# Patient Record
Sex: Male | Born: 1960 | Race: Black or African American | Hispanic: No | Marital: Single | Smoking: Never smoker
Health system: Southern US, Community
[De-identification: ages and names within clinical notes are randomized; demographics above are authoritative.]

## PROBLEM LIST (undated history)

## (undated) DIAGNOSIS — F329 Major depressive disorder, single episode, unspecified: Secondary | ICD-10-CM

## (undated) DIAGNOSIS — F32A Depression, unspecified: Secondary | ICD-10-CM

## (undated) DIAGNOSIS — I10 Essential (primary) hypertension: Secondary | ICD-10-CM

## (undated) HISTORY — PX: BACK SURGERY: SHX140

---

## 1998-04-18 ENCOUNTER — Emergency Department (HOSPITAL_COMMUNITY): Admission: EM | Admit: 1998-04-18 | Discharge: 1998-04-18 | Payer: Self-pay | Admitting: Emergency Medicine

## 1998-05-08 ENCOUNTER — Emergency Department (HOSPITAL_COMMUNITY): Admission: EM | Admit: 1998-05-08 | Discharge: 1998-05-08 | Payer: Self-pay | Admitting: Emergency Medicine

## 1998-06-19 ENCOUNTER — Emergency Department (HOSPITAL_COMMUNITY): Admission: EM | Admit: 1998-06-19 | Discharge: 1998-06-19 | Payer: Self-pay | Admitting: Emergency Medicine

## 1998-10-30 ENCOUNTER — Encounter: Admission: RE | Admit: 1998-10-30 | Discharge: 1998-12-02 | Payer: Self-pay

## 1999-06-07 ENCOUNTER — Emergency Department (HOSPITAL_COMMUNITY): Admission: EM | Admit: 1999-06-07 | Discharge: 1999-06-07 | Payer: Self-pay | Admitting: *Deleted

## 2000-03-11 ENCOUNTER — Emergency Department (HOSPITAL_COMMUNITY): Admission: EM | Admit: 2000-03-11 | Discharge: 2000-03-11 | Payer: Self-pay | Admitting: *Deleted

## 2000-07-11 ENCOUNTER — Emergency Department (HOSPITAL_COMMUNITY): Admission: EM | Admit: 2000-07-11 | Discharge: 2000-07-11 | Payer: Self-pay | Admitting: Emergency Medicine

## 2000-07-11 ENCOUNTER — Encounter: Payer: Self-pay | Admitting: Emergency Medicine

## 2001-02-03 ENCOUNTER — Encounter: Payer: Self-pay | Admitting: Emergency Medicine

## 2001-02-03 ENCOUNTER — Emergency Department (HOSPITAL_COMMUNITY): Admission: EM | Admit: 2001-02-03 | Discharge: 2001-02-03 | Payer: Self-pay | Admitting: Emergency Medicine

## 2001-07-21 ENCOUNTER — Emergency Department (HOSPITAL_COMMUNITY): Admission: EM | Admit: 2001-07-21 | Discharge: 2001-07-22 | Payer: Self-pay | Admitting: Emergency Medicine

## 2001-09-21 ENCOUNTER — Emergency Department (HOSPITAL_COMMUNITY): Admission: EM | Admit: 2001-09-21 | Discharge: 2001-09-21 | Payer: Self-pay | Admitting: Emergency Medicine

## 2002-08-26 ENCOUNTER — Emergency Department (HOSPITAL_COMMUNITY): Admission: EM | Admit: 2002-08-26 | Discharge: 2002-08-27 | Payer: Self-pay | Admitting: Emergency Medicine

## 2003-09-15 ENCOUNTER — Emergency Department (HOSPITAL_COMMUNITY): Admission: AD | Admit: 2003-09-15 | Discharge: 2003-09-15 | Payer: Self-pay | Admitting: Internal Medicine

## 2004-08-17 ENCOUNTER — Emergency Department (HOSPITAL_COMMUNITY): Admission: EM | Admit: 2004-08-17 | Discharge: 2004-08-17 | Payer: Self-pay | Admitting: Emergency Medicine

## 2005-03-31 ENCOUNTER — Emergency Department (HOSPITAL_COMMUNITY): Admission: EM | Admit: 2005-03-31 | Discharge: 2005-03-31 | Payer: Self-pay | Admitting: Emergency Medicine

## 2005-10-04 ENCOUNTER — Emergency Department (HOSPITAL_COMMUNITY): Admission: EM | Admit: 2005-10-04 | Discharge: 2005-10-04 | Payer: Self-pay | Admitting: Emergency Medicine

## 2007-02-05 ENCOUNTER — Emergency Department (HOSPITAL_COMMUNITY): Admission: EM | Admit: 2007-02-05 | Discharge: 2007-02-05 | Payer: Self-pay | Admitting: Emergency Medicine

## 2007-05-19 ENCOUNTER — Emergency Department (HOSPITAL_COMMUNITY): Admission: EM | Admit: 2007-05-19 | Discharge: 2007-05-20 | Payer: Self-pay | Admitting: Emergency Medicine

## 2007-09-11 ENCOUNTER — Emergency Department (HOSPITAL_COMMUNITY): Admission: EM | Admit: 2007-09-11 | Discharge: 2007-09-11 | Payer: Self-pay | Admitting: *Deleted

## 2008-04-12 ENCOUNTER — Emergency Department (HOSPITAL_COMMUNITY): Admission: EM | Admit: 2008-04-12 | Discharge: 2008-04-12 | Payer: Self-pay | Admitting: *Deleted

## 2008-05-19 ENCOUNTER — Emergency Department (HOSPITAL_COMMUNITY): Admission: EM | Admit: 2008-05-19 | Discharge: 2008-05-19 | Payer: Self-pay | Admitting: Emergency Medicine

## 2008-06-20 ENCOUNTER — Emergency Department (HOSPITAL_COMMUNITY): Admission: EM | Admit: 2008-06-20 | Discharge: 2008-06-20 | Payer: Self-pay | Admitting: Emergency Medicine

## 2009-11-07 ENCOUNTER — Inpatient Hospital Stay (HOSPITAL_COMMUNITY): Admission: RE | Admit: 2009-11-07 | Discharge: 2009-11-11 | Payer: Self-pay | Admitting: Orthopedic Surgery

## 2009-11-08 ENCOUNTER — Ambulatory Visit: Payer: Self-pay | Admitting: Vascular Surgery

## 2010-03-08 ENCOUNTER — Emergency Department (HOSPITAL_COMMUNITY): Admission: EM | Admit: 2010-03-08 | Discharge: 2010-03-08 | Payer: Self-pay | Admitting: Emergency Medicine

## 2010-06-16 ENCOUNTER — Emergency Department (HOSPITAL_BASED_OUTPATIENT_CLINIC_OR_DEPARTMENT_OTHER): Admission: EM | Admit: 2010-06-16 | Discharge: 2010-06-17 | Payer: Self-pay | Admitting: Emergency Medicine

## 2010-07-23 ENCOUNTER — Emergency Department (HOSPITAL_BASED_OUTPATIENT_CLINIC_OR_DEPARTMENT_OTHER): Admission: EM | Admit: 2010-07-23 | Discharge: 2010-07-23 | Payer: Self-pay | Admitting: Emergency Medicine

## 2010-08-28 ENCOUNTER — Ambulatory Visit: Payer: Self-pay | Admitting: Diagnostic Radiology

## 2010-08-28 ENCOUNTER — Emergency Department (HOSPITAL_BASED_OUTPATIENT_CLINIC_OR_DEPARTMENT_OTHER): Admission: EM | Admit: 2010-08-28 | Discharge: 2010-08-28 | Payer: Self-pay | Admitting: Emergency Medicine

## 2010-12-28 LAB — GLUCOSE, CAPILLARY
Glucose-Capillary: 154 mg/dL — ABNORMAL HIGH (ref 70–99)
Glucose-Capillary: 160 mg/dL — ABNORMAL HIGH (ref 70–99)
Glucose-Capillary: 169 mg/dL — ABNORMAL HIGH (ref 70–99)
Glucose-Capillary: 175 mg/dL — ABNORMAL HIGH (ref 70–99)
Glucose-Capillary: 186 mg/dL — ABNORMAL HIGH (ref 70–99)
Glucose-Capillary: 230 mg/dL — ABNORMAL HIGH (ref 70–99)
Glucose-Capillary: 239 mg/dL — ABNORMAL HIGH (ref 70–99)
Glucose-Capillary: 251 mg/dL — ABNORMAL HIGH (ref 70–99)
Glucose-Capillary: 289 mg/dL — ABNORMAL HIGH (ref 70–99)

## 2010-12-28 LAB — BASIC METABOLIC PANEL
Chloride: 95 mEq/L — ABNORMAL LOW (ref 96–112)
Creatinine, Ser: 1.01 mg/dL (ref 0.4–1.5)
Sodium: 133 mEq/L — ABNORMAL LOW (ref 135–145)

## 2010-12-28 LAB — TYPE AND SCREEN: ABO/RH(D): A POS

## 2010-12-28 LAB — CBC
Hemoglobin: 12.9 g/dL — ABNORMAL LOW (ref 13.0–17.0)
Platelets: 233 10*3/uL (ref 150–400)
RBC: 4.18 MIL/uL — ABNORMAL LOW (ref 4.22–5.81)

## 2011-01-01 IMAGING — CR DG CHEST 2V
2 series · 2 of 2 positions shown · non-contrast
Comparison: Chest x-ray of 09/11/2007

CLINICAL DATA: Preop for lumbar spine surgery

CHEST - 2 VIEW

[view not recorded (1 of 2)]
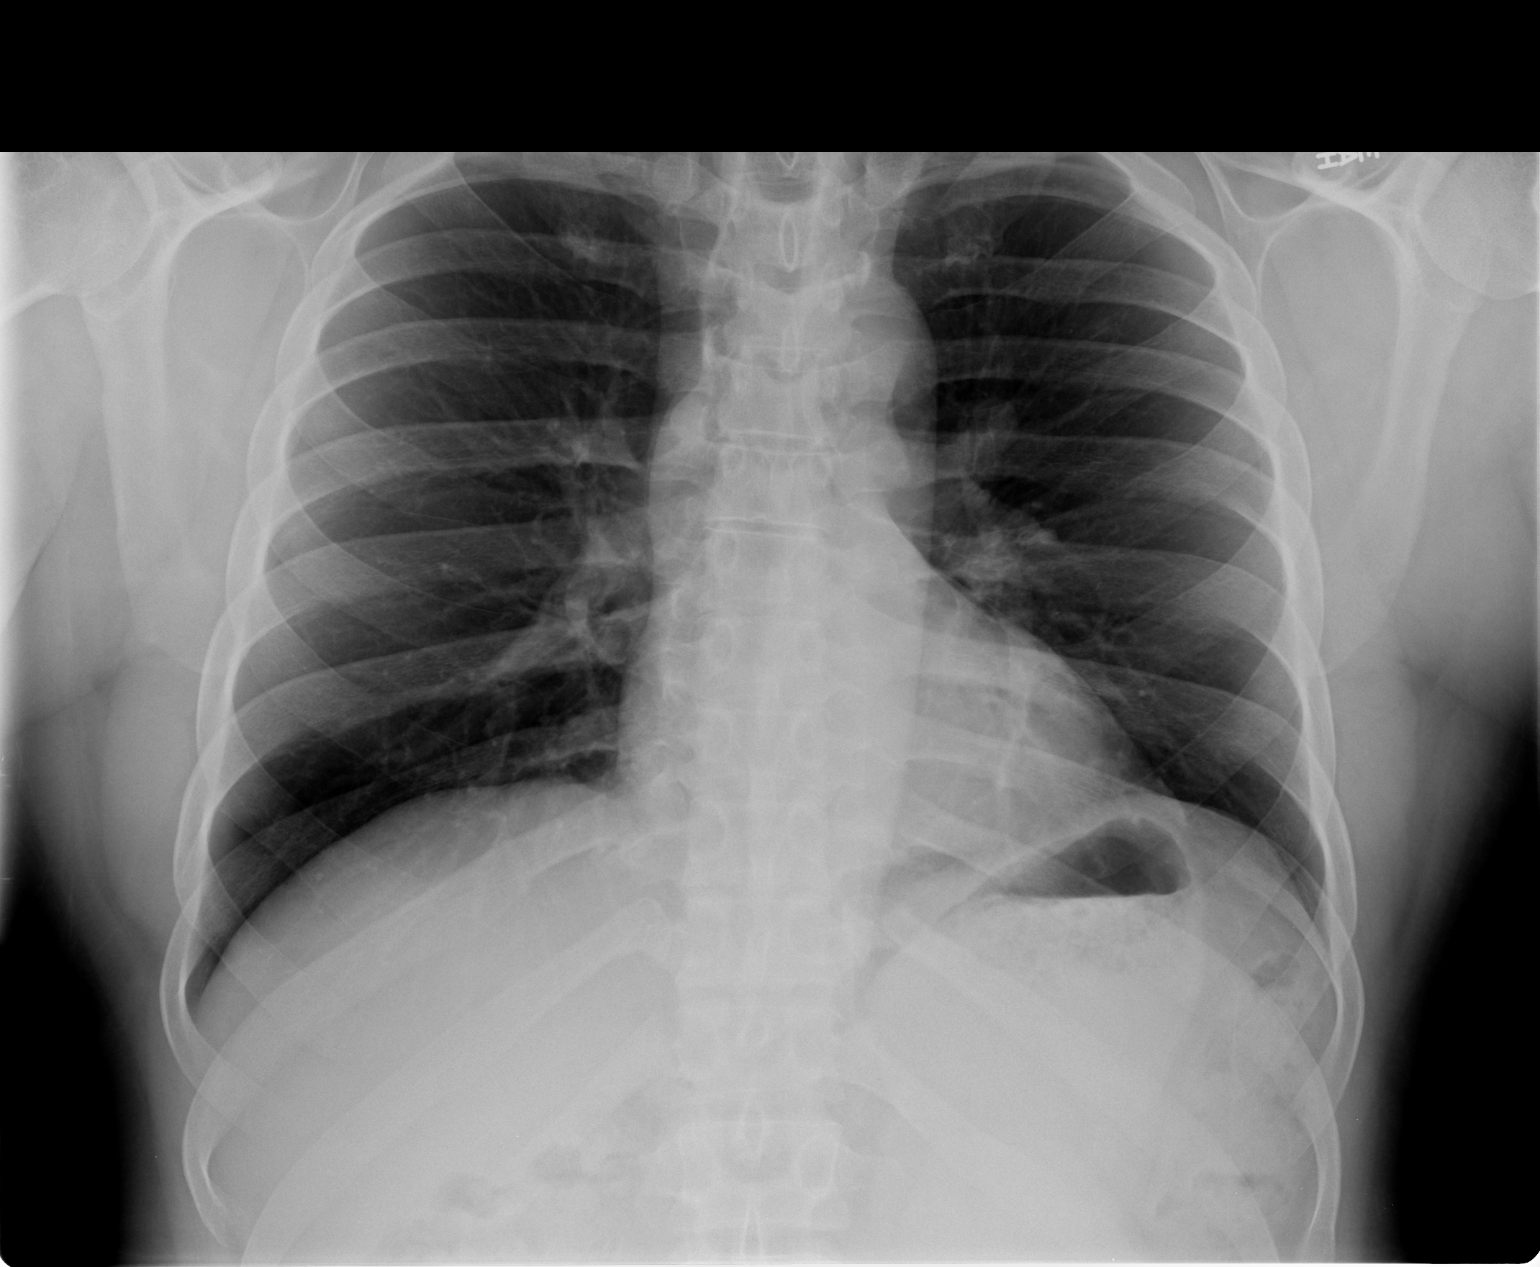

[view not recorded (2 of 2)]
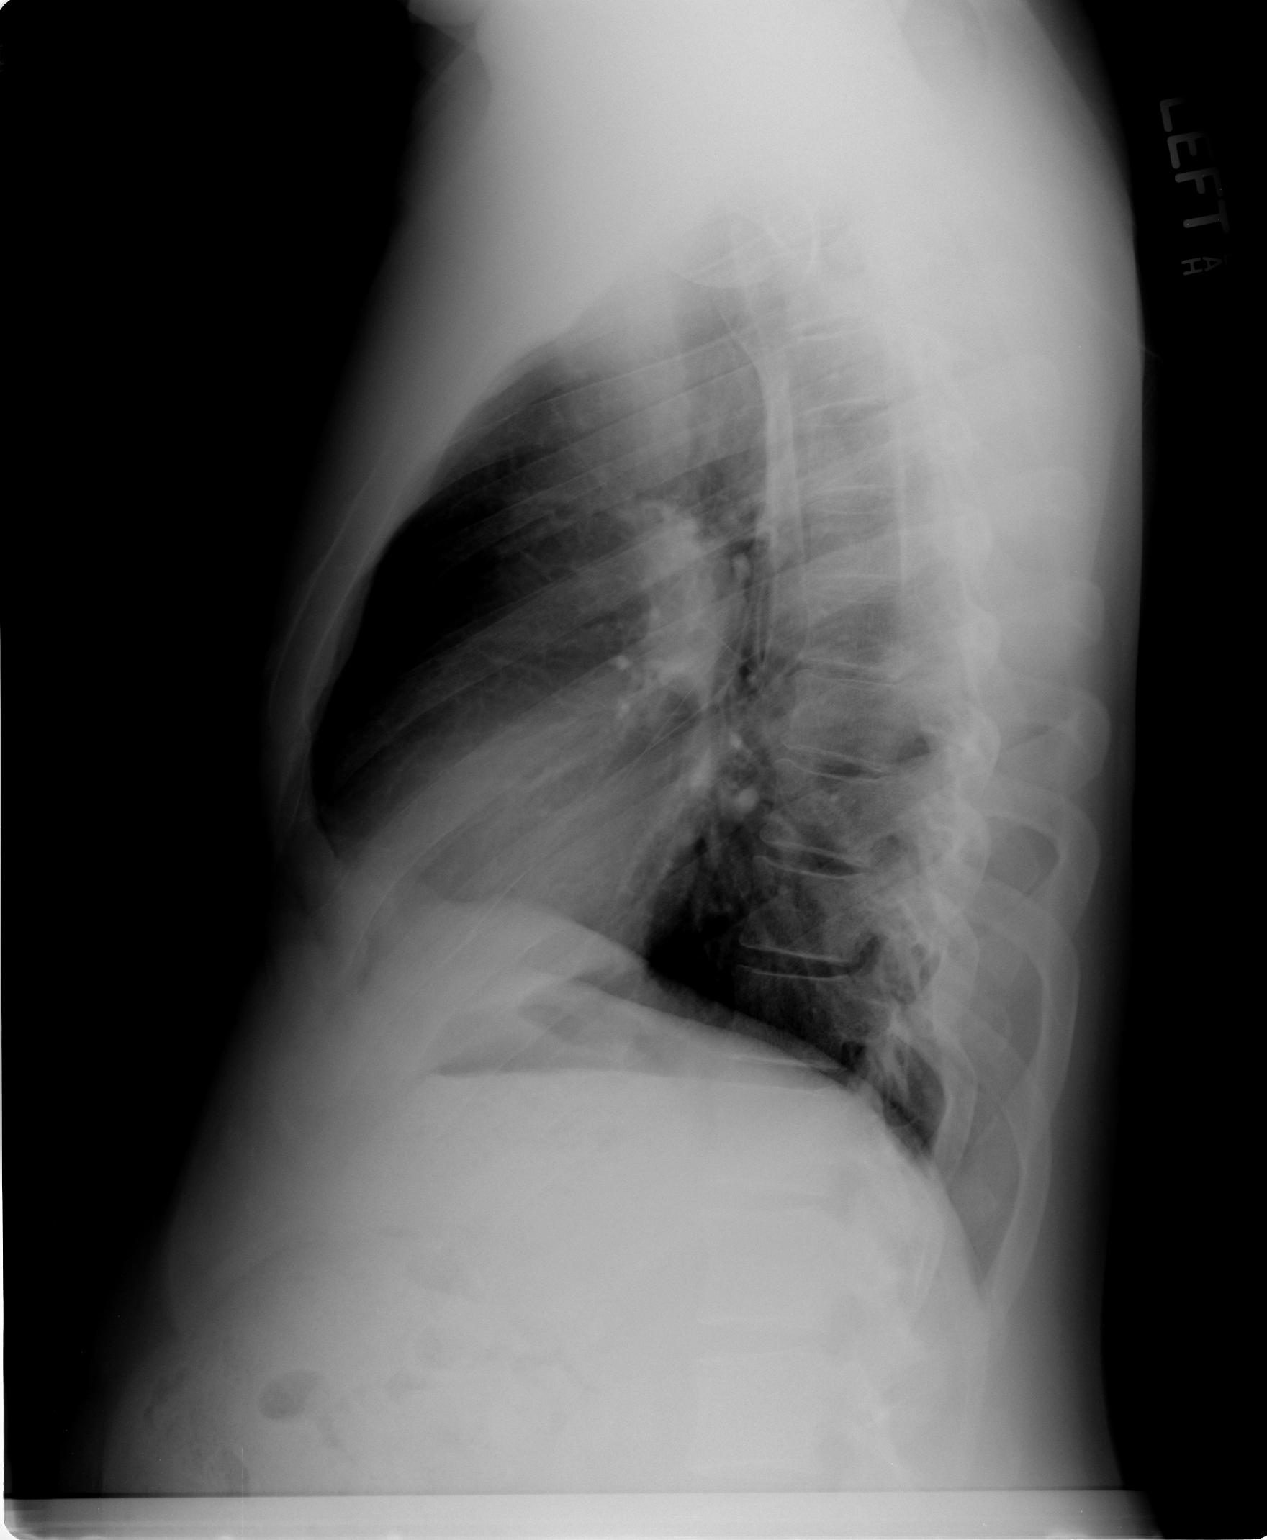

[2 of 2 positions shown; findings below may reference images not displayed]

FINDINGS: The lungs are clear.  Mediastinal contours are normal.
The heart is within normal limits in size.  No acute bony
abnormality is seen.
IMPRESSION: No active lung disease.

## 2011-01-01 IMAGING — CR DG LUMBAR SPINE 2-3V
2 series · 2 of 2 positions shown · non-contrast
Comparison: None.

CLINICAL DATA: 48-year-old male preoperative study for lumbar
surgery.

LUMBAR SPINE - 2-3 VIEW

[view not recorded (1 of 2)]
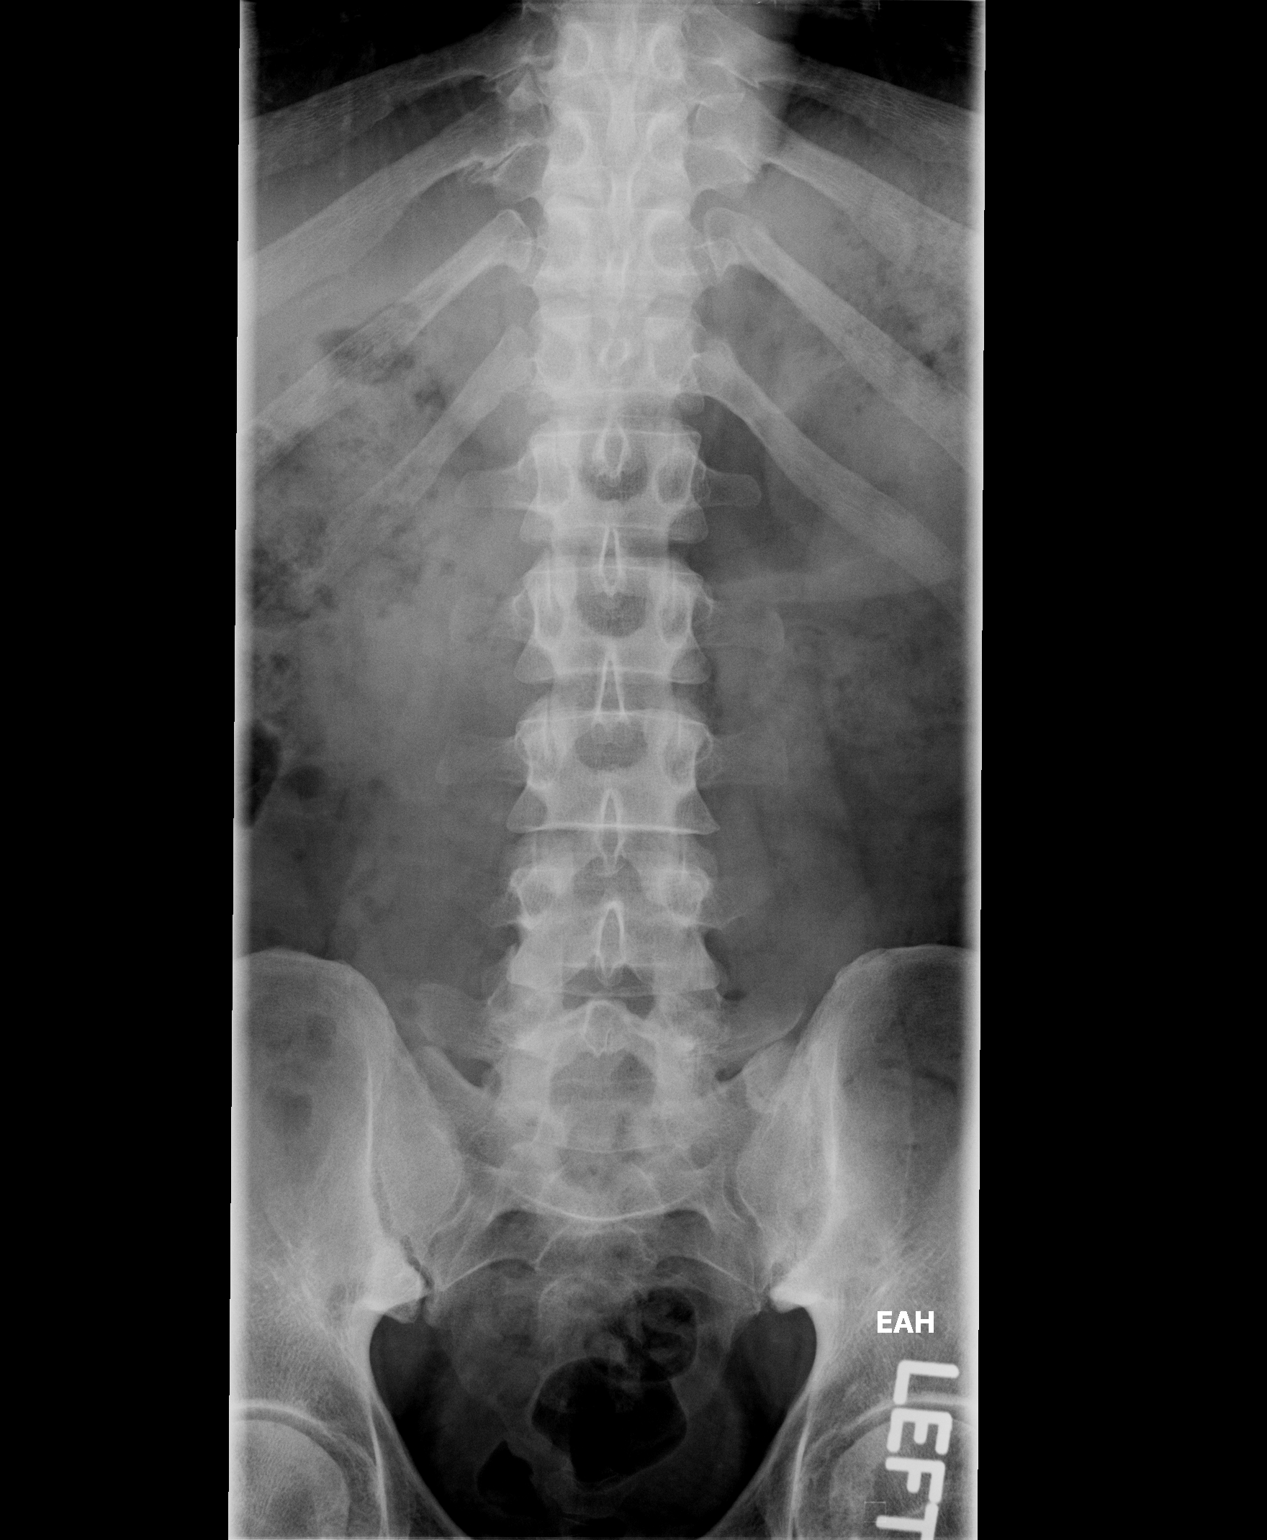

[view not recorded (2 of 2)]
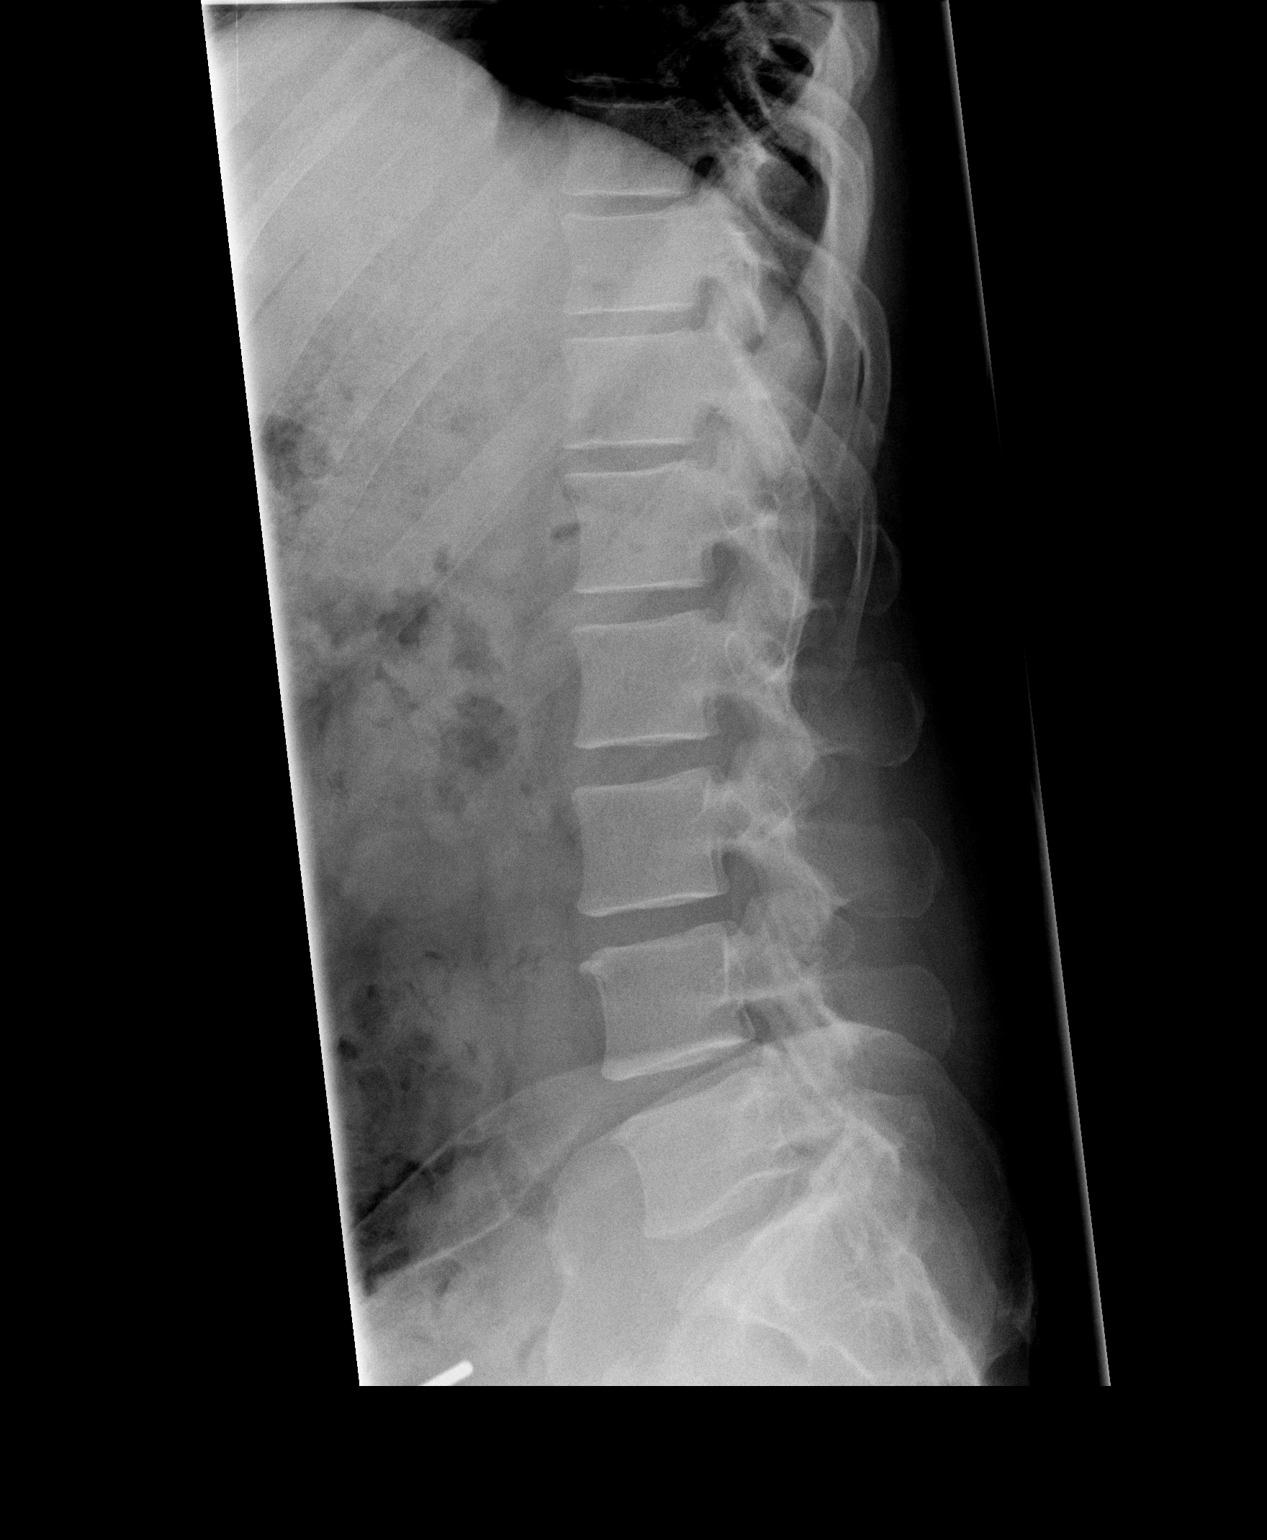

[2 of 2 positions shown; findings below may reference images not displayed]

FINDINGS: Bone mineralization is within normal limits.
Straightening of the spine.  Normal lumbar segmentation.
Relatively preserved disc space heights.  Preserved fibril body
height and alignment.
IMPRESSION: No acute osseous abnormality identified in the lumbar spine.  The
lumbar levels on these images were numbered in anticipation of
surgery.

## 2011-01-03 IMAGING — RF DG LUMBAR SPINE 2-3V
1 series · 2 of 2 positions shown · non-contrast
Comparison: 11/05/2009

CLINICAL DATA: L4-L5 lumbar fusion.

LUMBAR SPINE - 2-3 VIEW

[Series 1: run · 2 of 2 slices shown]
[im 1/2]
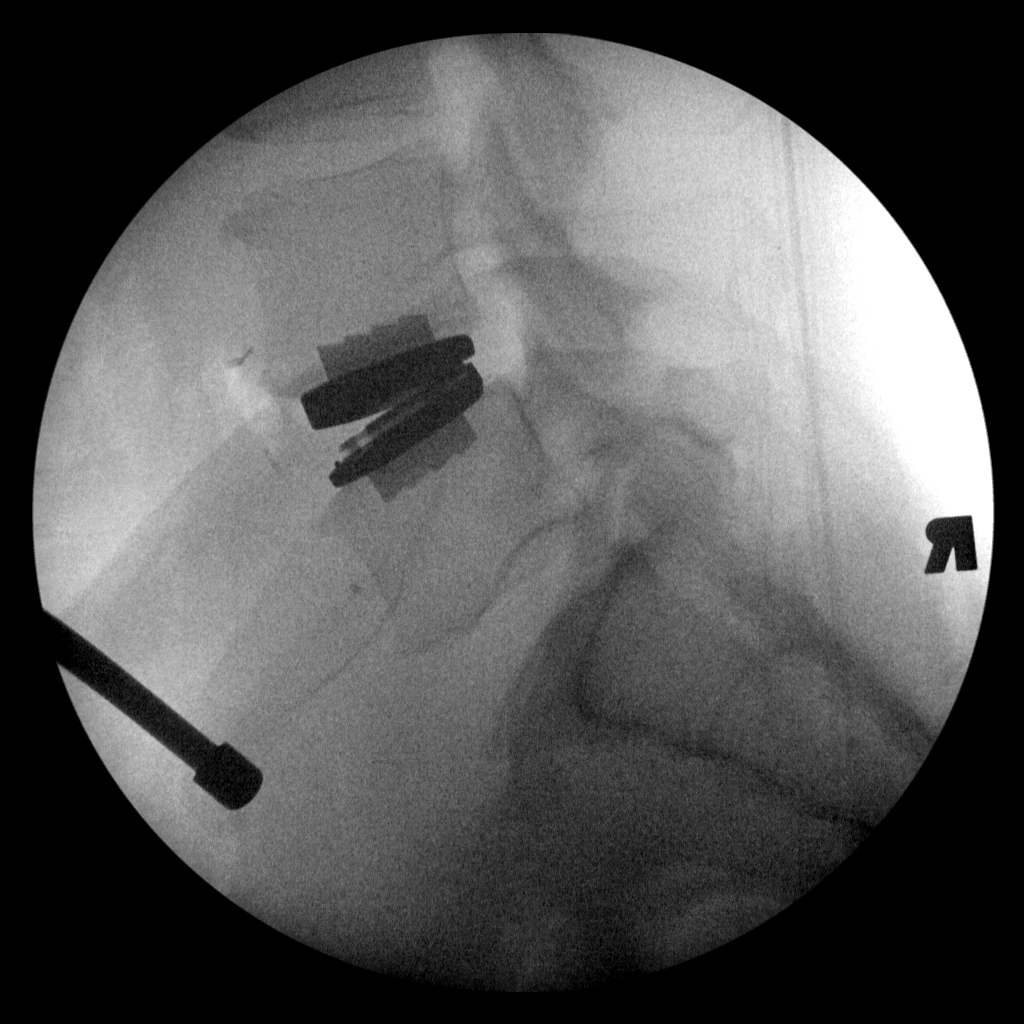
[im 2/2]
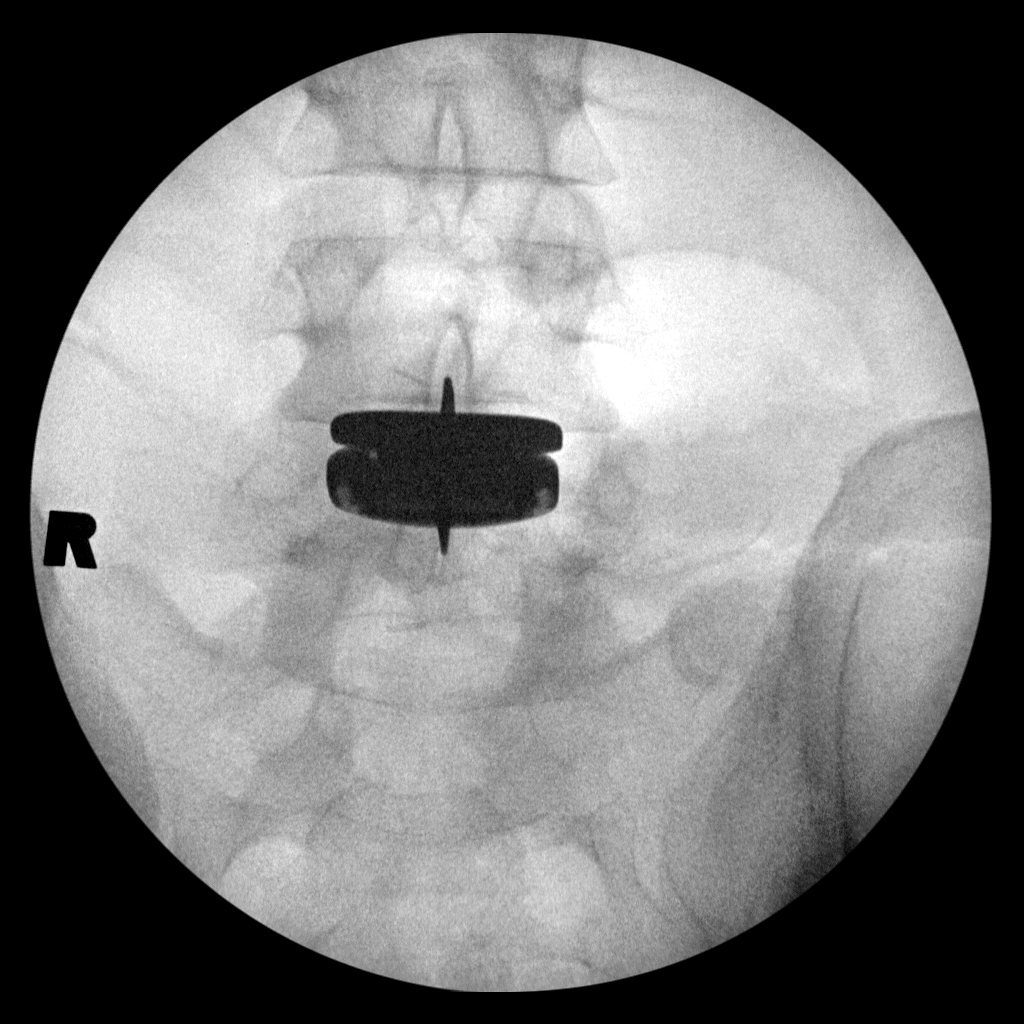

[2 of 2 positions shown; findings below may reference images not displayed]

FINDINGS: Two intraoperative spot views of the lower lumbar spine
are submitted postoperatively for interpretation.
An intervertebral disc replacement is identified at L4-L5.
No other significant abnormalities are present.
IMPRESSION: Intervertebral disc replacement at L4-L5.

## 2011-03-28 ENCOUNTER — Emergency Department (HOSPITAL_BASED_OUTPATIENT_CLINIC_OR_DEPARTMENT_OTHER)
Admission: EM | Admit: 2011-03-28 | Discharge: 2011-03-28 | Disposition: A | Discharge status: Home / Self Care | Payer: Self-pay | Attending: Emergency Medicine | Admitting: Emergency Medicine

## 2011-03-28 DIAGNOSIS — I10 Essential (primary) hypertension: Secondary | ICD-10-CM | POA: Insufficient documentation

## 2011-03-28 DIAGNOSIS — E119 Type 2 diabetes mellitus without complications: Secondary | ICD-10-CM | POA: Insufficient documentation

## 2011-03-28 DIAGNOSIS — K0510 Chronic gingivitis, plaque induced: Secondary | ICD-10-CM | POA: Insufficient documentation

## 2011-07-05 ENCOUNTER — Emergency Department (HOSPITAL_COMMUNITY)
Admission: EM | Admit: 2011-07-05 | Discharge: 2011-07-05 | Disposition: A | Discharge status: Home / Self Care | Payer: Self-pay | Attending: Emergency Medicine | Admitting: Emergency Medicine

## 2011-07-05 DIAGNOSIS — E119 Type 2 diabetes mellitus without complications: Secondary | ICD-10-CM | POA: Insufficient documentation

## 2011-07-05 DIAGNOSIS — I10 Essential (primary) hypertension: Secondary | ICD-10-CM | POA: Insufficient documentation

## 2011-07-05 DIAGNOSIS — M542 Cervicalgia: Secondary | ICD-10-CM | POA: Insufficient documentation

## 2011-07-05 DIAGNOSIS — M62838 Other muscle spasm: Secondary | ICD-10-CM | POA: Insufficient documentation

## 2011-07-15 LAB — POCT I-STAT, CHEM 8
HCT: 42
Hemoglobin: 14.3
Potassium: 3.8
Sodium: 136
TCO2: 28

## 2011-07-15 LAB — DIFFERENTIAL
Basophils Absolute: 0
Basophils Relative: 0
Eosinophils Absolute: 0
Eosinophils Relative: 0
Monocytes Absolute: 0.8
Neutro Abs: 3.7

## 2011-07-15 LAB — HEPATIC FUNCTION PANEL
AST: 23
Bilirubin, Direct: 0.1
Indirect Bilirubin: 1 — ABNORMAL HIGH

## 2011-07-15 LAB — CBC
Hemoglobin: 13.6
MCHC: 33.3
MCV: 91.7
RDW: 13.3

## 2011-07-15 LAB — LIPASE, BLOOD: Lipase: 20

## 2011-07-15 LAB — GLUCOSE, CAPILLARY: Glucose-Capillary: 246 — ABNORMAL HIGH

## 2011-09-20 ENCOUNTER — Encounter: Payer: Self-pay | Admitting: Emergency Medicine

## 2011-09-20 ENCOUNTER — Emergency Department (HOSPITAL_COMMUNITY)
Admission: EM | Admit: 2011-09-20 | Discharge: 2011-09-21 | Disposition: A | Discharge status: Home / Self Care | Payer: Self-pay | Attending: Emergency Medicine | Admitting: Emergency Medicine

## 2011-09-20 DIAGNOSIS — F329 Major depressive disorder, single episode, unspecified: Secondary | ICD-10-CM

## 2011-09-20 DIAGNOSIS — Z79899 Other long term (current) drug therapy: Secondary | ICD-10-CM | POA: Insufficient documentation

## 2011-09-20 DIAGNOSIS — F3289 Other specified depressive episodes: Secondary | ICD-10-CM | POA: Insufficient documentation

## 2011-09-20 DIAGNOSIS — I10 Essential (primary) hypertension: Secondary | ICD-10-CM | POA: Insufficient documentation

## 2011-09-20 DIAGNOSIS — E119 Type 2 diabetes mellitus without complications: Secondary | ICD-10-CM | POA: Insufficient documentation

## 2011-09-20 DIAGNOSIS — R45851 Suicidal ideations: Secondary | ICD-10-CM | POA: Insufficient documentation

## 2011-09-20 HISTORY — DX: Essential (primary) hypertension: I10

## 2011-09-20 HISTORY — DX: Major depressive disorder, single episode, unspecified: F32.9

## 2011-09-20 HISTORY — DX: Depression, unspecified: F32.A

## 2011-09-20 LAB — COMPREHENSIVE METABOLIC PANEL
Albumin: 4.1 g/dL (ref 3.5–5.2)
BUN: 13 mg/dL (ref 6–23)
Calcium: 9.8 mg/dL (ref 8.4–10.5)
Chloride: 103 mEq/L (ref 96–112)
Creatinine, Ser: 0.89 mg/dL (ref 0.50–1.35)
Total Bilirubin: 0.6 mg/dL (ref 0.3–1.2)

## 2011-09-20 LAB — CBC
HCT: 36.2 % — ABNORMAL LOW (ref 39.0–52.0)
MCH: 30.5 pg (ref 26.0–34.0)
MCHC: 35.4 g/dL (ref 30.0–36.0)
MCV: 86.4 fL (ref 78.0–100.0)
RDW: 14.3 % (ref 11.5–15.5)

## 2011-09-20 LAB — RAPID URINE DRUG SCREEN, HOSP PERFORMED
Cocaine: POSITIVE — AB
Opiates: NOT DETECTED
Tetrahydrocannabinol: NOT DETECTED

## 2011-09-20 MED ORDER — ACETAMINOPHEN 325 MG PO TABS: 650.0000 mg | ORAL_TABLET | ORAL | Status: DC | PRN

## 2011-09-20 MED ORDER — LORAZEPAM 1 MG PO TABS: 1.0000 mg | ORAL_TABLET | Freq: Three times a day (TID) | ORAL | Status: DC | PRN

## 2011-09-20 NOTE — ED Notes (Signed)
Pt was wanded by security.  Pt had a knife on him that was given to his mother per Jonny Ruiz in security.

## 2011-09-20 NOTE — ED Provider Notes (Signed)
History     CSN: [id number] Arrival date & time: 09/20/2011  7:07 PM   First MD Initiated Contact with Patient 09/20/11 1947      Chief Complaint  Patient presents with  . Depression    (Consider location/radiation/quality/duration/timing/severity/associated sxs/prior treatment) The history is provided by the patient.  pt w hx depression states is feeling very depressed. States wife died 17 years ago, depressed this time of year since. Occasionally having suicidal thoughts, currently denies thoughts of harm to self or others. Denies any other recent health related symptoms or complaints. Denies etoh abuse, but states is trying to cut back. Denies hx etoh withdrawal or seizures. Denies drug abuse. Normal appetite, eating normally. Denies od or misuse of meds. Symptoms waxing and waning.  Past Medical History  Diagnosis Date  . Depression   . Hypertension   . Diabetes mellitus     Past Surgical History  Procedure Date  . Back surgery     No family history on file.  History  Substance Use Topics  . Smoking status: Never Smoker   . Smokeless tobacco: Not on file  . Alcohol Use: Yes      Review of Systems  Constitutional: Negative for fever.  HENT: Negative for neck pain.   Eyes: Negative for redness.  Respiratory: Negative for shortness of breath.   Cardiovascular: Negative for chest pain.  Gastrointestinal: Negative for abdominal pain.  Genitourinary: Negative for flank pain.  Musculoskeletal: Negative for back pain.  Skin: Negative for rash.  Neurological: Negative for headaches.  Hematological: Does not bruise/bleed easily.  Psychiatric/Behavioral: Negative for agitation.    Allergies  Review of patient's allergies indicates no known allergies.  Home Medications   Current Outpatient Rx  Name Route Sig Dispense Refill  . GLIPIZIDE 10 MG PO TABS Oral Take 10 mg by mouth 2 (two) times daily before a meal.      . TRIAMTERENE-HCTZ 37.5-25 MG PO TABS Oral Take  1 tablet by mouth daily.        BP 125/75  Pulse 103  Temp(Src) 98.9 F (37.2 C) (Oral)  Resp 18  SpO2 99%  Physical Exam  Nursing note and vitals reviewed. Constitutional: He is oriented to person, place, and time. He appears well-developed and well-nourished. No distress.  HENT:  Head: Atraumatic.  Eyes: Pupils are equal, round, and reactive to light.  Neck: Neck supple. No tracheal deviation present.  Cardiovascular: Normal rate, regular rhythm, normal heart sounds and intact distal pulses.   Pulmonary/Chest: Effort normal and breath sounds normal. No accessory muscle usage. No respiratory distress.  Abdominal: Soft. There is no tenderness.  Musculoskeletal: Normal range of motion. He exhibits no edema and no tenderness.  Neurological: He is alert and oriented to person, place, and time.  Skin: Skin is warm and dry.  Psychiatric: He has a normal mood and affect.    ED Course  Procedures (including critical care time)     MDM  Labs sent. [facility] team consulted. Signed out to 3-12 EDP to f/u on labs when back and with act assessment, and dispo accordingly.         Suzi Roots, MD 09/20/11 [phone]

## 2011-09-20 NOTE — ED Notes (Signed)
Pt c/o depression.  States his wife died 17 years ago and he usually gets depressed between October and December.  States he saw one of his wife's friends that did not know she had died and the person asked about her and he has been depressed since then. Reports suicidal thoughts and thoughts of hurting other people.  Denies plan.

## 2011-09-20 NOTE — ED Notes (Signed)
[facility] person is at the bedside at this time.

## 2011-09-20 NOTE — ED Notes (Signed)
Pt is brought to room #26. Explained procedures to patient and he is agreeable. Lab is in at this time to draw pt blood work. Pt is given a urinal and asked to provide sample. Pt states that he is unable to do so at this time. Pt is provided with ice water to drink. Pt also states that he is depressed as his wife died 17 yrs ago. Pt states that he had suicidal ideations 2 weeks ago but is not having those thoughts at this time.

## 2011-09-20 NOTE — ED Notes (Signed)
[facility] is being repaged to find out their ETA.

## 2011-09-20 NOTE — ED Notes (Signed)
Pt is in room asking how much longer before [facility] team gets here. He states that if they are not here before 12 midnight that he is leaving. MD notified.

## 2011-09-20 NOTE — ED Notes (Signed)
Finger glucose is 230.

## 2011-09-21 NOTE — ED Notes (Signed)
[facility] worker states that this pt can go home. Pt is up for discharge. As soon as I get his paper work will get him discharged.

## 2011-09-21 NOTE — Discharge Instructions (Signed)

## 2011-09-21 NOTE — ED Provider Notes (Signed)
Patient no longer suicidal. States that he had thoughts 2 weeks ago. Patient has contracted for safety and will be discharged home with outpatient resources  Dayton Bailiff, MD 09/21/11 0003

## 2012-03-05 ENCOUNTER — Emergency Department (HOSPITAL_COMMUNITY)
Admission: EM | Admit: 2012-03-05 | Discharge: 2012-03-05 | Disposition: A | Discharge status: Home / Self Care | Payer: BC Managed Care – PPO | Attending: Emergency Medicine | Admitting: Emergency Medicine

## 2012-03-05 ENCOUNTER — Encounter (HOSPITAL_COMMUNITY): Payer: Self-pay

## 2012-03-05 DIAGNOSIS — E119 Type 2 diabetes mellitus without complications: Secondary | ICD-10-CM | POA: Insufficient documentation

## 2012-03-05 DIAGNOSIS — I10 Essential (primary) hypertension: Secondary | ICD-10-CM | POA: Insufficient documentation

## 2012-03-05 DIAGNOSIS — E869 Volume depletion, unspecified: Secondary | ICD-10-CM | POA: Insufficient documentation

## 2012-03-05 DIAGNOSIS — J40 Bronchitis, not specified as acute or chronic: Secondary | ICD-10-CM

## 2012-03-05 MED ORDER — IBUPROFEN 600 MG PO TABS: 600.0000 mg | ORAL_TABLET | Freq: Three times a day (TID) | ORAL | Status: AC | PRN

## 2012-03-05 MED ORDER — AZITHROMYCIN 250 MG PO TABS: 250.0000 mg | ORAL_TABLET | Freq: Every day | ORAL | Status: AC

## 2012-03-05 MED ORDER — IBUPROFEN 200 MG PO TABS
600.0000 mg | ORAL_TABLET | Freq: Once | ORAL | Status: AC
  Administered 2012-03-05: 600 mg via ORAL
  Filled 2012-03-05: qty 3

## 2012-03-05 NOTE — ED Provider Notes (Signed)
History   This chart was scribed for Lyanne Co, MD by Melba Coon. The patient was seen in room STRE2/STRE2 and the patient's care was started at 12:23PM.    CSN: [id number]  Arrival date & time 03/05/12  1111   First MD Initiated Contact with Patient 03/05/12 1149      Chief Complaint  Patient presents with  . Cough     HPI Nathan Duran is a 51 y.o. male who presents to the Emergency Department complaining of persistent, moderate to severe headache with associated cough, chest congestion and generalized body aches with an onset a week ago. Pt states that he feels like "somebody has beat him up". Diaphoresis present; pt states that he has had to constantly change T-shirts. Diarrhea earlier in week but stopped on its own. Nml appetite and fluid intake. Sore throat present. No fever at home, neck pain, rash, back pain, abd pain, nausea, vomit, dysuria, or extremity edema, numbness, or tingling. No known allergies. No other pertinent medical symptoms.   Past Medical History  Diagnosis Date  . Depression   . Hypertension   . Diabetes mellitus     Past Surgical History  Procedure Date  . Back surgery     No family history on file.  History  Substance Use Topics  . Smoking status: Never Smoker   . Smokeless tobacco: Not on file  . Alcohol Use: Yes      Review of Systems 10 Systems reviewed and all are negative for acute change except as noted in the HPI.   Allergies  Review of patient's allergies indicates no known allergies.  Home Medications   Current Outpatient Rx  Name Route Sig Dispense Refill  . ACETAMINOPHEN 500 MG PO TABS Oral Take 500 mg by mouth every 4 (four) hours as needed. pain    . GUAIFENESIN 100 MG/5ML PO LIQD Oral Take 200 mg by mouth 3 (three) times daily as needed. Cough/congestion    . LISINOPRIL 10 MG PO TABS Oral Take 10 mg by mouth daily.    Marland Kitchen METFORMIN HCL ER 500 MG PO TB24 Oral Take 500 mg by mouth daily with breakfast.  Sometimes takes twice a day if sugar goes up    . NASAL SPRAY NA Nasal Place 1 Squirt into the nose 2 (two) times daily as needed. congestion      BP 137/93  Pulse 108  Temp(Src) 98.8 F (37.1 C) (Oral)  Resp 20  SpO2 98%  Physical Exam  Nursing note and vitals reviewed. Constitutional: He is oriented to person, place, and time. He appears well-developed and well-nourished. No distress.  HENT:  Head: Normocephalic and atraumatic.  Mouth/Throat: Oropharynx is clear and moist. No oropharyngeal exudate.       Posterior oropharynx: uvula midline; no peritonsillar abscess noticed  Eyes: EOM are normal.  Neck: Neck supple. No tracheal deviation present.  Cardiovascular: Normal rate, regular rhythm and normal heart sounds.   No murmur heard. Pulmonary/Chest: Effort normal and breath sounds normal. No respiratory distress. He has no wheezes.  Musculoskeletal: Normal range of motion. He exhibits no edema.  Neurological: He is alert and oriented to person, place, and time.  Skin: Skin is warm and dry.  Psychiatric: He has a normal mood and affect. His behavior is normal.    ED Course  Procedures (including critical care time)  DIAGNOSTIC STUDIES: Oxygen Saturation is 98% on room air, normal by my interpretation.    COORDINATION OF CARE:  12:28PM -  EDMD advises that pt drink plenty of fluids and takes lots of Tylenol or ibuprofen. Pt ready for d/c   Labs Reviewed - No data to display No results found.   1. Bronchitis   2. Volume depletion       MDM  The patient's vital signs are stable.  He does have a pulse rate of 108.  I suspect this is mild volume depletion and possibly low-grade fever.  Ibuprofen given in the emergency department.  The patient will orally hydrate himself at home.  Given the duration of his symptoms all place the patient on a Z-Pak as he may be developing early atypical pneumonia.  He has no shortness of breath at this time his pulse ox 98%.  Patient is  otherwise well-appearing.  He understands to return the emergency department for new or worsening symptoms   I personally performed the services described in this documentation, which was scribed in my presence. The recorded information has been reviewed and considered.           Lyanne Co, MD 03/05/12 [phone]

## 2012-03-05 NOTE — Discharge Instructions (Signed)
Bronchitis  Bronchitis is the body's way of reacting to injury and/or infection (inflammation) of the bronchi. Bronchi are the air tubes that extend from the windpipe into the lungs. If the inflammation becomes severe, it may cause shortness of breath.  CAUSES   Inflammation may be caused by:   A virus.   Germs (bacteria).   Dust.   Allergens.   Pollutants and many other irritants.  The cells lining the bronchial tree are covered with tiny hairs (cilia). These constantly beat upward, away from the lungs, toward the mouth. This keeps the lungs free of pollutants. When these cells become too irritated and are unable to do their job, mucus begins to develop. This causes the characteristic cough of bronchitis. The cough clears the lungs when the cilia are unable to do their job. Without either of these protective mechanisms, the mucus would settle in the lungs. Then you would develop pneumonia.  Smoking is a common cause of bronchitis and can contribute to pneumonia. Stopping this habit is the single most important thing you can do to help yourself.  TREATMENT    Your caregiver may prescribe an antibiotic if the cough is caused by bacteria. Also, medicines that open up your airways make it easier to breathe. Your caregiver may also recommend or prescribe an expectorant. It will loosen the mucus to be coughed up. Only take over-the-counter or prescription medicines for pain, discomfort, or fever as directed by your caregiver.   Removing whatever causes the problem (smoking, for example) is critical to preventing the problem from getting worse.   Cough suppressants may be prescribed for relief of cough symptoms.   Inhaled medicines may be prescribed to help with symptoms now and to help prevent problems from returning.   For those with recurrent (chronic) bronchitis, there may be a need for steroid medicines.  SEEK IMMEDIATE MEDICAL CARE IF:    During treatment, you develop more pus-like mucus (purulent  sputum).   You have a fever.   Your baby is older than 3 months with a rectal temperature of 102 F (38.9 C) or higher.   Your baby is 3 months old or younger with a rectal temperature of 100.4 F (38 C) or higher.   You become progressively more ill.   You have increased difficulty breathing, wheezing, or shortness of breath.  It is necessary to seek immediate medical care if you are elderly or sick from any other disease.  MAKE SURE YOU:    Understand these instructions.   Will watch your condition.   Will get help right away if you are not doing well or get worse.  Document Released: 09/28/2005 Document Revised: 09/17/2011 Document Reviewed: 08/07/2008  Exit[employer] Patient Information 2012 [employer]  Dehydration, Adult  Dehydration is when you lose more fluids from the body than you take in. Vital organs like the kidneys, brain, and heart cannot function without a proper amount of fluids and salt. Any loss of fluids from the body can cause dehydration.   CAUSES    Vomiting.   Diarrhea.   Excessive sweating.   Excessive urine output.   Fever.  SYMPTOMS   Mild dehydration   Thirst.   Dry lips.   Slightly dry mouth.  Moderate dehydration   Very dry mouth.   Sunken eyes.   Skin does not bounce back quickly when lightly pinched and released.   Dark urine and decreased urine production.   Decreased tear production.   Headache.  Severe dehydration     Very dry mouth.   Extreme thirst.   Rapid, weak pulse (more than 100 beats per minute at rest).   Cold hands and feet.   Not able to sweat in spite of heat and temperature.   Rapid breathing.   Blue lips.   Confusion and lethargy.   Difficulty being awakened.   Minimal urine production.   No tears.  DIAGNOSIS   Your caregiver will diagnose dehydration based on your symptoms and your exam. Blood and urine tests will help confirm the diagnosis. The diagnostic evaluation should also identify the cause of dehydration.  TREATMENT   Treatment  of mild or moderate dehydration can often be done at home by increasing the amount of fluids that you drink. It is best to drink small amounts of fluid more often. Drinking too much at one time can make vomiting worse. Refer to the home care instructions below.  Severe dehydration needs to be treated at the hospital where you will probably be given intravenous (IV) fluids that contain water and electrolytes.  HOME CARE INSTRUCTIONS    Ask your caregiver about specific rehydration instructions.   Drink enough fluids to keep your urine clear or pale yellow.   Drink small amounts frequently if you have nausea and vomiting.   Eat as you normally do.   Avoid:   Foods or drinks high in sugar.   Carbonated drinks.   Juice.   Extremely hot or cold fluids.   Drinks with caffeine.   Fatty, greasy foods.   Alcohol.   Tobacco.   Overeating.   Gelatin desserts.   Wash your hands well to avoid spreading bacteria and viruses.   Only take over-the-counter or prescription medicines for pain, discomfort, or fever as directed by your caregiver.   Ask your caregiver if you should continue all prescribed and over-the-counter medicines.   Keep all follow-up appointments with your caregiver.  SEEK MEDICAL CARE IF:   You have abdominal pain and it increases or stays in one area (localizes).   You have a rash, stiff neck, or severe headache.   You are irritable, sleepy, or difficult to awaken.   You are weak, dizzy, or extremely thirsty.  SEEK IMMEDIATE MEDICAL CARE IF:    You are unable to keep fluids down or you get worse despite treatment.   You have frequent episodes of vomiting or diarrhea.   You have blood or green matter (bile) in your vomit.   You have blood in your stool or your stool looks black and tarry.   You have not urinated in 6 to 8 hours, or you have only urinated a small amount of very dark urine.   You have a fever.   You faint.  MAKE SURE YOU:    Understand these instructions.   Will  watch your condition.   Will get help right away if you are not doing well or get worse.  Document Released: 09/28/2005 Document Revised: 09/17/2011 Document Reviewed: 05/18/2011  [employer] Patient Information 2012 [employer].

## 2012-03-05 NOTE — ED Notes (Signed)
Cough, chest congestion headache, body aches sweating. Began about 1 week ago sore throat

## 2012-04-27 ENCOUNTER — Emergency Department (HOSPITAL_COMMUNITY)
Admission: EM | Admit: 2012-04-27 | Discharge: 2012-04-27 | Disposition: A | Discharge status: Home / Self Care | Payer: BC Managed Care – PPO | Attending: Emergency Medicine | Admitting: Emergency Medicine

## 2012-04-27 ENCOUNTER — Encounter (HOSPITAL_COMMUNITY): Payer: Self-pay | Admitting: *Deleted

## 2012-04-27 DIAGNOSIS — W260XXA Contact with knife, initial encounter: Secondary | ICD-10-CM | POA: Insufficient documentation

## 2012-04-27 DIAGNOSIS — E119 Type 2 diabetes mellitus without complications: Secondary | ICD-10-CM | POA: Insufficient documentation

## 2012-04-27 DIAGNOSIS — F329 Major depressive disorder, single episode, unspecified: Secondary | ICD-10-CM | POA: Insufficient documentation

## 2012-04-27 DIAGNOSIS — I10 Essential (primary) hypertension: Secondary | ICD-10-CM | POA: Insufficient documentation

## 2012-04-27 DIAGNOSIS — Y93G1 Activity, food preparation and clean up: Secondary | ICD-10-CM | POA: Insufficient documentation

## 2012-04-27 DIAGNOSIS — F3289 Other specified depressive episodes: Secondary | ICD-10-CM | POA: Insufficient documentation

## 2012-04-27 DIAGNOSIS — S61209A Unspecified open wound of unspecified finger without damage to nail, initial encounter: Secondary | ICD-10-CM | POA: Insufficient documentation

## 2012-04-27 DIAGNOSIS — Z23 Encounter for immunization: Secondary | ICD-10-CM | POA: Insufficient documentation

## 2012-04-27 DIAGNOSIS — S61019A Laceration without foreign body of unspecified thumb without damage to nail, initial encounter: Secondary | ICD-10-CM

## 2012-04-27 MED ORDER — TETANUS-DIPHTH-ACELL PERTUSSIS 5-2.5-18.5 LF-MCG/0.5 IM SUSP
0.5000 mL | Freq: Once | INTRAMUSCULAR | Status: AC
  Administered 2012-04-27: 0.5 mL via INTRAMUSCULAR
  Filled 2012-04-27: qty 0.5

## 2012-04-27 MED ORDER — BACITRACIN ZINC 500 UNIT/GM EX OINT
1.0000 "application " | TOPICAL_OINTMENT | Freq: Once | CUTANEOUS | Status: DC
  Filled 2012-04-27: qty 15

## 2012-04-27 MED ORDER — CEPHALEXIN 500 MG PO CAPS: 500.0000 mg | ORAL_CAPSULE | Freq: Four times a day (QID) | ORAL | Status: AC

## 2012-04-27 NOTE — ED Notes (Signed)
Patient with laceration to his right thumb from yesterday around 1pm.  People is diabetic and wants to be evaluated.

## 2012-04-27 NOTE — ED Notes (Signed)
u shaped lac to plantar aspect of rgt thumb at DIP joint; accidentally cut with knife yesterday at approx 1300; states put "liquid bandaide" on it after injury occurred; no active bleeding noted at this time; able to flex and extend all 5 digits of left hand; strong radial pulse palpable; brisk cap refill

## 2012-04-27 NOTE — ED Notes (Signed)
wound to rgt thumb cleansed with soap and water; bacitracin ointment applied; dry dressing placed over wound - pt tolerated procedure well

## 2012-04-28 NOTE — ED Provider Notes (Signed)
History     CSN: [id number]  Arrival date & time 04/27/12  1857   First MD Initiated Contact with Patient 04/27/12 2220      Chief Complaint  Patient presents with  . Laceration    (Consider location/radiation/quality/duration/timing/severity/associated sxs/prior treatment) HPI Comments: Nathan Duran 51 y.o. male   The chief complaint is: Patient presents with:   Laceration   The patient has medical history significant for:   Past Medical History:   Depression                                                   Hypertension                                                 Diabetes mellitus                                           Patient with a history signigant for DM II, presents with a laceration with a the right thumb that occurred yesterday at 1:30pm while cooking. Patient states that he was preparing food when he sliced inner part of his thumb. Denies fever, chills. Denies NVD. Denies SOB, CP. Denies numbness, tingling, or impaired ROM. Patient unsure of his last tetanus shot but believes it to be in 2005. Patient states his blood sugars run in the 130's and is unsure of his last A1C.      Patient is a 51 y.o. male presenting with skin laceration. The history is provided by the patient.  Laceration     Past Medical History  Diagnosis Date  . Depression   . Hypertension   . Diabetes mellitus     Past Surgical History  Procedure Date  . Back surgery     History reviewed. No pertinent family history.  History  Substance Use Topics  . Smoking status: Never Smoker   . Smokeless tobacco: Not on file  . Alcohol Use: Yes      Review of Systems  Constitutional: Negative for fever and chills.  Respiratory: Negative.  Negative for shortness of breath.   Cardiovascular: Negative.   Gastrointestinal: Negative.   Musculoskeletal: Negative for joint swelling.  Skin: Positive for wound.  Neurological: Negative for weakness and numbness.    Allergies    Review of patient's allergies indicates no known allergies.  Home Medications   Current Outpatient Rx  Name Route Sig Dispense Refill  . LISINOPRIL 10 MG PO TABS Oral Take 10 mg by mouth daily.    Marland Kitchen METFORMIN HCL ER 500 MG PO TB24 Oral Take 500 mg by mouth daily with breakfast. Sometimes takes twice a day if sugar goes up    . CEPHALEXIN 500 MG PO CAPS Oral Take 1 capsule (500 mg total) by mouth 4 (four) times daily. 40 capsule 0    BP 124/67  Pulse 78  Temp 97.5 F (36.4 C) (Oral)  Resp 20  SpO2 100%  Physical Exam  Nursing note and vitals reviewed. Constitutional: He appears well-developed and well-nourished.  HENT:  Head: Normocephalic and atraumatic.  Cardiovascular: Normal rate, regular  rhythm and normal heart sounds.   Pulmonary/Chest: Effort normal and breath sounds normal.  Abdominal: Soft. Bowel sounds are normal. There is no tenderness.  Musculoskeletal: Normal range of motion. He exhibits tenderness. He exhibits no edema.       Patient has normal range of motion in right thumb and hand. So tenderness noted on palpation.  Neurological: He is alert.  Skin: Skin is warm and dry. Laceration noted. There is erythema.     Psychiatric:       Erythema noted around laceration site.    ED Course  Procedures (including critical care time)  Labs Reviewed - No data to display No results found.   1. Laceration of thumb       MDM  Patient presented for laceration that occurred while cooking 36 hours ago. Laceration repair not warranted. Wound cleaned, sterile dressing, and antibiotic ointment applied. Tetanus given in ED. Patient discharged on Keflex and instructed to follow-up with his PCP. Patient has no red flags for cellulitis. Return precautions given verbally and in discharge instructions.        Pixie Casino, PA-C 04/28/12 0126

## 2012-04-29 NOTE — ED Provider Notes (Signed)
Medical screening examination/treatment/procedure(s) were performed by non-physician practitioner and as supervising physician I was immediately available for consultation/collaboration.   Naksh Radi, MD 04/29/12 1534 

## 2012-05-07 ENCOUNTER — Encounter (HOSPITAL_COMMUNITY): Payer: Self-pay

## 2012-05-07 ENCOUNTER — Emergency Department (HOSPITAL_COMMUNITY)
Admission: EM | Admit: 2012-05-07 | Discharge: 2012-05-08 | Disposition: A | Discharge status: Home / Self Care | Payer: BC Managed Care – PPO | Attending: Emergency Medicine | Admitting: Emergency Medicine

## 2012-05-07 DIAGNOSIS — I10 Essential (primary) hypertension: Secondary | ICD-10-CM | POA: Insufficient documentation

## 2012-05-07 DIAGNOSIS — F329 Major depressive disorder, single episode, unspecified: Secondary | ICD-10-CM

## 2012-05-07 DIAGNOSIS — F3289 Other specified depressive episodes: Secondary | ICD-10-CM | POA: Insufficient documentation

## 2012-05-07 DIAGNOSIS — Z79899 Other long term (current) drug therapy: Secondary | ICD-10-CM | POA: Insufficient documentation

## 2012-05-07 DIAGNOSIS — E119 Type 2 diabetes mellitus without complications: Secondary | ICD-10-CM | POA: Insufficient documentation

## 2012-05-07 LAB — ETHANOL: Alcohol, Ethyl (B): <11 mg/dL (ref 0–11)

## 2012-05-07 LAB — COMPREHENSIVE METABOLIC PANEL
ALT: 20 U/L (ref 0–53)
AST: 22 U/L (ref 0–37)
CO2: 27 mEq/L (ref 19–32)
Chloride: 103 mEq/L (ref 96–112)
GFR calc non Af Amer: >90 mL/min (ref >90)
Glucose, Bld: 184 mg/dL — ABNORMAL HIGH (ref 70–99)
Sodium: 140 mEq/L (ref 135–145)
Total Bilirubin: 0.7 mg/dL (ref 0.3–1.2)

## 2012-05-07 LAB — RAPID URINE DRUG SCREEN, HOSP PERFORMED
Benzodiazepines: NOT DETECTED
Opiates: NOT DETECTED

## 2012-05-07 LAB — CBC WITH DIFFERENTIAL/PLATELET
Basophils Absolute: 0 10*3/uL (ref 0.0–0.1)
Eosinophils Absolute: 0.1 10*3/uL (ref 0.0–0.7)
Eosinophils Relative: 2 % (ref 0–5)
Lymphocytes Relative: 29 % (ref 12–46)
MCV: 87.2 fL (ref 78.0–100.0)
Neutrophils Relative %: 57 % (ref 43–77)
Platelets: 264 10*3/uL (ref 150–400)
RBC: 4.47 MIL/uL (ref 4.22–5.81)
RDW: 13.1 % (ref 11.5–15.5)
WBC: 4.6 10*3/uL (ref 4.0–10.5)

## 2012-05-07 LAB — ACETAMINOPHEN LEVEL: Acetaminophen (Tylenol), Serum: <15 ug/mL (ref 10–30)

## 2012-05-07 MED ORDER — ACETAMINOPHEN 325 MG PO TABS
650.0000 mg | ORAL_TABLET | Freq: Once | ORAL | Status: AC
  Administered 2012-05-07: 650 mg via ORAL
  Filled 2012-05-07: qty 2

## 2012-05-07 MED ORDER — LISINOPRIL 10 MG PO TABS
10.0000 mg | ORAL_TABLET | Freq: Every day | ORAL | Status: DC
  Administered 2012-05-07 – 2012-05-08 (×2): 10 mg via ORAL
  Filled 2012-05-07 (×2): qty 1

## 2012-05-07 MED ORDER — METFORMIN HCL ER 500 MG PO TB24
500.0000 mg | ORAL_TABLET | Freq: Every day | ORAL | Status: DC
  Administered 2012-05-08: 500 mg via ORAL
  Filled 2012-05-07 (×2): qty 1

## 2012-05-07 NOTE — ED Notes (Signed)
Pt here with depression since running into friends of wife, and his wife has passed, pt sts has had bad dreams and mad at the world reports HI and SI

## 2012-05-07 NOTE — ED Notes (Signed)
Dinner tray ordered.

## 2012-05-07 NOTE — ED Notes (Addendum)
Charge RN made aware of need for sitter, unable to reach [facility] several times, charge is going to communicate with [facility]

## 2012-05-07 NOTE — ED Notes (Signed)
Report given to Angel.
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
## 2012-05-07 NOTE — ED Notes (Signed)
Repot received from ED (RN).Pt with GCS 15.He expresses the thought of ideas of harming himself and harming others.Pt lying in bed,cooperative and watching TV.
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
## 2012-05-08 NOTE — ED Provider Notes (Signed)
Pt was seen by telepsych and cleared for discharge.  Pt did not have transport overnight.  Olivia Mackie, MD 05/08/12 0730

## 2012-05-08 NOTE — ED Notes (Addendum)
Pt is d/c home, pt's sister is coming to pick him up, pt is unsure when he will get home to take his own BP medication, pt requested to receive prescribed BP med from [place] prior to leaving our facility

## 2012-05-08 NOTE — ED Notes (Signed)
Pt given personal belongings, Nida Boatman security assisted pt w/his belongings to ensure pt received all of his arrival contents.

## 2012-05-08 NOTE — ED Notes (Signed)
Spoke with physician re: discharge of patient/paitent's concern regarding transportation.

## 2013-04-02 ENCOUNTER — Encounter (HOSPITAL_COMMUNITY): Payer: Self-pay | Admitting: Emergency Medicine

## 2013-04-02 ENCOUNTER — Emergency Department (HOSPITAL_COMMUNITY): Payer: Self-pay

## 2013-04-02 ENCOUNTER — Emergency Department (HOSPITAL_COMMUNITY)
Admission: EM | Admit: 2013-04-02 | Discharge: 2013-04-02 | Disposition: A | Discharge status: Home / Self Care | Payer: Self-pay | Attending: Emergency Medicine | Admitting: Emergency Medicine

## 2013-04-02 DIAGNOSIS — F39 Unspecified mood [affective] disorder: Secondary | ICD-10-CM | POA: Insufficient documentation

## 2013-04-02 DIAGNOSIS — E119 Type 2 diabetes mellitus without complications: Secondary | ICD-10-CM | POA: Insufficient documentation

## 2013-04-02 DIAGNOSIS — I10 Essential (primary) hypertension: Secondary | ICD-10-CM | POA: Insufficient documentation

## 2013-04-02 DIAGNOSIS — Z79899 Other long term (current) drug therapy: Secondary | ICD-10-CM | POA: Insufficient documentation

## 2013-04-02 DIAGNOSIS — R5381 Other malaise: Secondary | ICD-10-CM | POA: Insufficient documentation

## 2013-04-02 DIAGNOSIS — F3289 Other specified depressive episodes: Secondary | ICD-10-CM | POA: Insufficient documentation

## 2013-04-02 DIAGNOSIS — R5383 Other fatigue: Secondary | ICD-10-CM

## 2013-04-02 DIAGNOSIS — F329 Major depressive disorder, single episode, unspecified: Secondary | ICD-10-CM | POA: Insufficient documentation

## 2013-04-02 DIAGNOSIS — R350 Frequency of micturition: Secondary | ICD-10-CM | POA: Insufficient documentation

## 2013-04-02 LAB — URINALYSIS, ROUTINE W REFLEX MICROSCOPIC
Ketones, ur: NEGATIVE mg/dL
Leukocytes, UA: NEGATIVE
Nitrite: NEGATIVE
Specific Gravity, Urine: 1.022 (ref 1.005–1.030)
pH: 5.5 (ref 5.0–8.0)

## 2013-04-02 LAB — CBC
Platelets: 259 10*3/uL (ref 150–400)
RBC: 4.21 MIL/uL — ABNORMAL LOW (ref 4.22–5.81)
WBC: 4.2 10*3/uL (ref 4.0–10.5)

## 2013-04-02 LAB — COMPREHENSIVE METABOLIC PANEL
BUN: 13 mg/dL (ref 6–23)
CO2: 29 mEq/L (ref 19–32)
Chloride: 105 mEq/L (ref 96–112)
Creatinine, Ser: 0.79 mg/dL (ref 0.50–1.35)
GFR calc non Af Amer: >90 mL/min (ref >90)
Glucose, Bld: 200 mg/dL — ABNORMAL HIGH (ref 70–99)
Total Bilirubin: 0.6 mg/dL (ref 0.3–1.2)

## 2013-04-02 MED ORDER — LISINOPRIL 10 MG PO TABS: 10.0000 mg | ORAL_TABLET | Freq: Every day | ORAL | Status: DC

## 2013-04-02 MED ORDER — METFORMIN HCL 1000 MG PO TABS: 1000.0000 mg | ORAL_TABLET | Freq: Two times a day (BID) | ORAL | Status: DC

## 2013-04-02 NOTE — ED Notes (Signed)
MD at bedside. 

## 2013-04-02 NOTE — ED Provider Notes (Signed)
History     CSN: [id number]  Arrival date & time 04/02/13  1254   First MD Initiated Contact with Patient 04/02/13 1348      Chief Complaint  Patient presents with  . Fatigue    (Consider location/radiation/quality/duration/timing/severity/associated sxs/prior treatment) The history is provided by the patient.   patient has had fatigue for the last few weeks. He states he's been more sleepy. He states he's lost about 15 pounds. He also has had some depression. No suicidal thoughts. He states he's had a lot of stress in his life. He states his been urinating frequently but has been off his medicines for a few weeks. He states he's been able to get ahold is Dr. No chest pain or trouble breathing. No cough. No abdominal pain. No diarrhea. No headache. No confusion. No sore throat. No hemoptysis. No blood in stool.  Past Medical History  Diagnosis Date  . Depression   . Hypertension   . Diabetes mellitus     Past Surgical History  Procedure Laterality Date  . Back surgery      No family history on file.  History  Substance Use Topics  . Smoking status: Never Smoker   . Smokeless tobacco: Not on file  . Alcohol Use: Yes      Review of Systems  Constitutional: Positive for fatigue and unexpected weight change. Negative for activity change and appetite change.  HENT: Negative for neck stiffness.   Eyes: Negative for pain.  Respiratory: Negative for chest tightness and shortness of breath.   Cardiovascular: Negative for chest pain and leg swelling.  Gastrointestinal: Negative for nausea, vomiting, abdominal pain and diarrhea.  Genitourinary: Negative for flank pain.  Musculoskeletal: Negative for back pain.  Skin: Negative for rash.  Neurological: Negative for weakness, numbness and headaches.  Psychiatric/Behavioral: Positive for dysphoric mood. Negative for behavioral problems.    Allergies  Review of patient's allergies indicates no known allergies.  Home  Medications   Current Outpatient Rx  Name  Route  Sig  Dispense  Refill  . lisinopril (PRINIVIL,ZESTRIL) 10 MG tablet   Oral   Take 1 tablet (10 mg total) by mouth daily.   30 tablet   0   . metFORMIN (GLUCOPHAGE) 1000 MG tablet   Oral   Take 1 tablet (1,000 mg total) by mouth 2 (two) times daily with a meal.   60 tablet   0     BP 162/94  Pulse 73  Temp(Src) 97.9 F (36.6 C) (Oral)  Resp 13  SpO2 100%  Physical Exam  Nursing note reviewed. Constitutional: He is oriented to person, place, and time. He appears well-developed and well-nourished.  HENT:  Head: Normocephalic and atraumatic.  Eyes: EOM are normal. Pupils are equal, round, and reactive to light.  Neck: Normal range of motion. Neck supple. No thyromegaly present.  Cardiovascular: Normal rate, regular rhythm and normal heart sounds.   No murmur heard. Pulmonary/Chest: Effort normal and breath sounds normal.  Abdominal: Soft. Bowel sounds are normal. He exhibits no distension and no mass. There is no tenderness. There is no rebound and no guarding.  Musculoskeletal: Normal range of motion. He exhibits no edema.  Neurological: He is alert and oriented to person, place, and time. No cranial nerve deficit.  Skin: Skin is warm and dry.  Psychiatric: He has a normal mood and affect.    ED Course  Procedures (including critical care time)  Labs Reviewed  GLUCOSE, CAPILLARY - Abnormal; Notable for the following:  Glucose-Capillary 222 (*)    All other components within normal limits  COMPREHENSIVE METABOLIC PANEL - Abnormal; Notable for the following:    Glucose, Bld 200 (*)    All other components within normal limits  CBC - Abnormal; Notable for the following:    RBC 4.21 (*)    Hemoglobin 12.8 (*)    HCT 37.0 (*)    All other components within normal limits  URINALYSIS, ROUTINE W REFLEX MICROSCOPIC - Abnormal; Notable for the following:    Glucose, UA >1000 (*)    All other components within normal  limits  URINE MICROSCOPIC-ADD ON  TSH   Dg Chest 2 View  04/02/2013   *RADIOLOGY REPORT*  Clinical Data: Fatigue.  CHEST - 2 VIEW  Comparison: 08/28/2010  Findings: Heart size and pulmonary vascularity are normal and the lungs are clear.  No osseous abnormality.  IMPRESSION: Normal exam.   Original Report Authenticated By: Francene Boyers, M.D.     1. Fatigue      Date: 04/02/2013  Rate: 51  Rhythm: normal sinus rhythm  QRS Axis: normal  Intervals: normal  ST/T Wave abnormalities: normal  Conduction Disutrbances: none  Narrative Interpretation: unremarkable     MDM  Patient's fatigue. Has had some weight loss. Laboratory reassuring except for mild hyperglycemia. He was not in DKA. His been off his medications. EKG is reassuring. He'll be discharged home to followup with his primary care Dr. Roland Rack was sent to be followed by his PCP        Juliet Rude. Rubin Payor, MD 04/02/13 1536

## 2013-04-02 NOTE — ED Notes (Signed)
Pt states that he has been very stressed lately and he thinks it may be contributing to his fatigue. Pt states that one day he'll feel great and the next day he is too tired "like they put 10 tons of bricks on my shoulders." Denies any pain. No neuro deficits. NAD noted at this time. Pt ambulated to room with quick, steady gait.

## 2013-04-02 NOTE — ED Notes (Addendum)
Pt reports feeling weak and fatigue for about 2 and 1/2 weeks. Pt denies N/V/D. Pt had some abdominal pain for about 2 days but since resolved. Pt reports had bug bite marks on right shoulder and arm about 1 week ago. Pt has not had any of his medications for about 1 month. Pt cannot get in touch with his Dr for refill.

## 2013-07-29 ENCOUNTER — Emergency Department (HOSPITAL_COMMUNITY)
Admission: EM | Admit: 2013-07-29 | Discharge: 2013-07-29 | Disposition: A | Discharge status: Home / Self Care | Payer: Worker's Compensation | Attending: Emergency Medicine | Admitting: Emergency Medicine

## 2013-07-29 ENCOUNTER — Emergency Department (HOSPITAL_COMMUNITY): Payer: Worker's Compensation

## 2013-07-29 ENCOUNTER — Encounter (HOSPITAL_COMMUNITY): Payer: Self-pay | Admitting: Emergency Medicine

## 2013-07-29 DIAGNOSIS — R209 Unspecified disturbances of skin sensation: Secondary | ICD-10-CM | POA: Insufficient documentation

## 2013-07-29 DIAGNOSIS — Y92009 Unspecified place in unspecified non-institutional (private) residence as the place of occurrence of the external cause: Secondary | ICD-10-CM | POA: Insufficient documentation

## 2013-07-29 DIAGNOSIS — Y939 Activity, unspecified: Secondary | ICD-10-CM | POA: Insufficient documentation

## 2013-07-29 DIAGNOSIS — S4992XA Unspecified injury of left shoulder and upper arm, initial encounter: Secondary | ICD-10-CM

## 2013-07-29 DIAGNOSIS — S4980XA Other specified injuries of shoulder and upper arm, unspecified arm, initial encounter: Secondary | ICD-10-CM | POA: Insufficient documentation

## 2013-07-29 DIAGNOSIS — M719 Bursopathy, unspecified: Secondary | ICD-10-CM | POA: Insufficient documentation

## 2013-07-29 DIAGNOSIS — E119 Type 2 diabetes mellitus without complications: Secondary | ICD-10-CM | POA: Insufficient documentation

## 2013-07-29 DIAGNOSIS — M67919 Unspecified disorder of synovium and tendon, unspecified shoulder: Secondary | ICD-10-CM | POA: Insufficient documentation

## 2013-07-29 DIAGNOSIS — I10 Essential (primary) hypertension: Secondary | ICD-10-CM | POA: Insufficient documentation

## 2013-07-29 DIAGNOSIS — Z8659 Personal history of other mental and behavioral disorders: Secondary | ICD-10-CM | POA: Insufficient documentation

## 2013-07-29 DIAGNOSIS — M7582 Other shoulder lesions, left shoulder: Secondary | ICD-10-CM

## 2013-07-29 DIAGNOSIS — W19XXXA Unspecified fall, initial encounter: Secondary | ICD-10-CM

## 2013-07-29 DIAGNOSIS — S46909A Unspecified injury of unspecified muscle, fascia and tendon at shoulder and upper arm level, unspecified arm, initial encounter: Secondary | ICD-10-CM | POA: Insufficient documentation

## 2013-07-29 DIAGNOSIS — W010XXA Fall on same level from slipping, tripping and stumbling without subsequent striking against object, initial encounter: Secondary | ICD-10-CM | POA: Insufficient documentation

## 2013-07-29 MED ORDER — HYDROCODONE-ACETAMINOPHEN 5-325 MG PO TABS: 1.0000 | ORAL_TABLET | Freq: Four times a day (QID) | ORAL | Status: DC | PRN

## 2013-07-29 MED ORDER — IBUPROFEN 800 MG PO TABS: 800.0000 mg | ORAL_TABLET | Freq: Three times a day (TID) | ORAL | Status: DC

## 2013-07-29 MED ORDER — IBUPROFEN 400 MG PO TABS
800.0000 mg | ORAL_TABLET | Freq: Once | ORAL | Status: AC
  Administered 2013-07-29: 800 mg via ORAL
  Filled 2013-07-29: qty 2

## 2013-07-29 NOTE — ED Notes (Signed)
Spoke with ortho tech for arm sling

## 2013-07-29 NOTE — ED Notes (Signed)
Pt dc to home. Pt sts understanding to dc instructions. Pt ambulatory to exit without difficulty.  Pt denies need for w/c.  

## 2013-07-29 NOTE — ED Notes (Signed)
Pt transported to radiology.

## 2013-07-29 NOTE — ED Provider Notes (Signed)
CSN: [id number]     Arrival date & time 07/29/13  1844 History   This chart was scribed for non-physician practitioner Nathan Forth, PA-C working with Nathan Munch, MD by Nathan Duran, ED Scribe. This patient was seen in room TR10C/TR10C and the patient's care was started at 7:10 PM.   Chief Complaint  Patient presents with  . Shoulder Pain    The history is provided by the patient and medical records. No language interpreter was used.   HPI Comments: Nathan Duran is a 52 y.o. male who presents to the Emergency Department complaining of left shoulder pain due to a fall yesterday. Pt landed on left shoulder and rolled. Pt has been icing the affected area along with taking Tylenol with mild relief. Denies weakness. Tingling is present down the the fingers. Tingling is worsened with touch. Movement and palpation make the pain worse.  Pt denies neck pain or back pain. He denies hitting his head or and LOC.     Past Medical History  Diagnosis Date  . Depression   . Hypertension   . Diabetes mellitus    Past Surgical History  Procedure Laterality Date  . Back surgery     History reviewed. No pertinent family history. History  Substance Use Topics  . Smoking status: Never Smoker   . Smokeless tobacco: Not on file  . Alcohol Use: Yes    Review of Systems  Constitutional: Negative for fever and chills.  Gastrointestinal: Negative for nausea and vomiting.  Musculoskeletal: Positive for arthralgias, joint swelling and myalgias. Negative for back pain, neck pain and neck stiffness.       Shoulder pain Arm pain  Skin: Negative for wound.  Neurological: Negative for numbness.       Paresthesias L arm  Hematological: Does not bruise/bleed easily.  Psychiatric/Behavioral: The patient is not nervous/anxious.   All other systems reviewed and are negative.    Allergies  Review of patient's allergies indicates no known allergies.  Home Medications   Current Outpatient  Rx  Name  Route  Sig  Dispense  Refill  . lisinopril (PRINIVIL,ZESTRIL) 10 MG tablet   Oral   Take 1 tablet (10 mg total) by mouth daily.   30 tablet   0   . metFORMIN (GLUCOPHAGE) 1000 MG tablet   Oral   Take 1 tablet (1,000 mg total) by mouth 2 (two) times daily with a meal.   60 tablet   0   . HYDROcodone-acetaminophen (NORCO/VICODIN) 5-325 MG per tablet   Oral   Take 1-2 tablets by mouth every 6 (six) hours as needed for pain.   15 tablet   0   . ibuprofen (ADVIL,MOTRIN) 800 MG tablet   Oral   Take 1 tablet (800 mg total) by mouth 3 (three) times daily. With food   21 tablet   0    Triage Vitals:BP 163/101  Pulse 82  Temp(Src) 98.3 F (36.8 C) (Oral)  Resp 20  SpO2 98% Physical Exam  Nursing note and vitals reviewed. Constitutional: He appears well-developed and well-nourished. No distress.  HENT:  Head: Normocephalic and atraumatic.  Eyes: Conjunctivae are normal.  Neck: Normal range of motion.  Cardiovascular: Normal rate, regular rhythm, normal heart sounds and intact distal pulses.   No murmur heard. Pulses:      Radial pulses are 2+ on the right side, and 2+ on the left side.       Dorsalis pedis pulses are 2+ on the right side, and  2+ on the left side.       Posterior tibial pulses are 2+ on the right side, and 2+ on the left side.  Capillary refill < 3 sec  Pulmonary/Chest: Effort normal and breath sounds normal. No respiratory distress.  Musculoskeletal: He exhibits tenderness. He exhibits no edema.       Left shoulder: He exhibits decreased range of motion, tenderness and pain. He exhibits no bony tenderness, no swelling, no effusion, no crepitus, no deformity, no laceration, no spasm, normal pulse and normal strength.  ROM: mildly decreased in the left shoulder  Positive empty can test Positive Hawkin's test Negative drop arm Negative Spurling test  Neurological: He is alert. He exhibits normal muscle tone. Coordination normal. GCS eye subscore is  4. GCS verbal subscore is 5. GCS motor subscore is 6.  Sensation intact Strength 5/5 in the fingers, wrist and elbow; 3/5 strength in the left shoulder 2/2 pain  Negative Spurling's test  Skin: Skin is warm and dry. He is not diaphoretic. No erythema.  No tenting of the skin  Psychiatric: He has a normal mood and affect.    ED Course  Procedures (including critical care time) DIAGNOSTIC STUDIES: Oxygen Saturation is 98% on RA, normal by my interpretation.    COORDINATION OF CARE: 7:25 PM- Discussed treatment plan with pt at bedside. Pt verbalized understanding and agreement with plan.   Labs Review Labs Reviewed - No data to display Imaging Review Dg Shoulder Left  07/29/2013   CLINICAL DATA:  Fall  EXAM: LEFT SHOULDER - 2+ VIEW  COMPARISON:  None.  FINDINGS: No acute fracture. No dislocation.  IMPRESSION: No acute bony pathology.   Electronically Signed   By: Nathan Duran M.D.   On: 07/29/2013 20:10    EKG Interpretation   None       MDM   1. Rotator cuff tendonitis, left   2. Shoulder injury, left, initial encounter   3. Fall at home, initial encounter      Nathan Duran presents with posterior shoulder pain after a fall at home.  Patient X-Ray negative for obvious fracture or dislocation. I personally reviewed the imaging tests through PACS system.  I reviewed available ER/hospitalization records through the EMR.   Pain managed in ED. Pt advised to follow up with orthopedics if symptoms persist for further evaluation of his rotator cuff as I believe this is the source of his pain. Patient given sling while in ED, conservative therapy recommended and discussed. Patient will be dc home & is agreeable with above plan.  It has been determined that no acute conditions requiring further emergency intervention are present at this time. The patient/guardian have been advised of the diagnosis and plan. We have discussed signs and symptoms that warrant return to the ED, such as  changes or worsening in symptoms.   Vital signs are stable at discharge.   BP 163/101  Pulse 82  Temp(Src) 98.3 F (36.8 C) (Oral)  Resp 20  SpO2 98%  Patient/guardian has voiced understanding and agreed to follow-up with the PCP or specialist.    I personally performed the services described in this documentation, which was scribed in my presence. The recorded information has been reviewed and is accurate.     Dahlia Client Apollo Timothy, PA-C 07/29/13 2038

## 2013-07-29 NOTE — ED Provider Notes (Signed)
  Medical screening examination/treatment/procedure(s) were performed by non-physician practitioner and as supervising physician I was immediately available for consultation/collaboration.    Fayetta Sorenson, MD 07/29/13 2207 

## 2013-07-29 NOTE — ED Notes (Signed)
Pt c/o left shoulder pain x 2 days since tripping and fall; CMS intact

## 2013-07-29 NOTE — Progress Notes (Signed)
Orthopedic Tech Progress Note Patient Details:  Nathan Duran [birth year] [id number]  Ortho Devices Type of Ortho Device: Arm sling Ortho Device/Splint Location: LUE Ortho Device/Splint Interventions: Ordered;Application   Jennye Moccasin 07/29/2013, 8:42 PM

## 2014-01-05 ENCOUNTER — Emergency Department (HOSPITAL_COMMUNITY)
Admission: EM | Admit: 2014-01-05 | Discharge: 2014-01-05 | Disposition: A | Discharge status: Home / Self Care | Payer: Worker's Compensation | Attending: Emergency Medicine | Admitting: Emergency Medicine

## 2014-01-05 ENCOUNTER — Encounter (HOSPITAL_COMMUNITY): Payer: Self-pay | Admitting: Emergency Medicine

## 2014-01-05 DIAGNOSIS — I10 Essential (primary) hypertension: Secondary | ICD-10-CM | POA: Insufficient documentation

## 2014-01-05 DIAGNOSIS — M25512 Pain in left shoulder: Secondary | ICD-10-CM

## 2014-01-05 DIAGNOSIS — F3289 Other specified depressive episodes: Secondary | ICD-10-CM | POA: Insufficient documentation

## 2014-01-05 DIAGNOSIS — M25519 Pain in unspecified shoulder: Secondary | ICD-10-CM | POA: Insufficient documentation

## 2014-01-05 DIAGNOSIS — M5412 Radiculopathy, cervical region: Secondary | ICD-10-CM | POA: Insufficient documentation

## 2014-01-05 DIAGNOSIS — F329 Major depressive disorder, single episode, unspecified: Secondary | ICD-10-CM | POA: Insufficient documentation

## 2014-01-05 MED ORDER — OXYCODONE HCL 5 MG PO TABS: 5.0000 mg | ORAL_TABLET | Freq: Three times a day (TID) | ORAL | Status: DC | PRN

## 2014-01-05 NOTE — ED Notes (Signed)
Pt reports that he was seen here back in October for left shoulder pain and neck pain after a fall at work. States that he has seen the ortho MD but pain is not getting any better.

## 2014-01-05 NOTE — ED Provider Notes (Signed)
CSN: [id number]     Arrival date & time 01/05/14  1633 History   First MD Initiated Contact with Patient 01/05/14 1850 This chart was scribed for non-physician practitioner Dierdre Forth, PA-C working with Gavin Pound. Oletta Lamas, MD by Valera Castle, ED scribe. This patient was seen in room TR05C/TR05C and the patient's care was started at 6:55 PM.     Chief Complaint  Patient presents with  . Shoulder Pain  . Neck Pain   (Consider location/radiation/quality/duration/timing/severity/associated sxs/prior Treatment) The history is provided by the patient and medical records. No language interpreter was used.   HPI Comments: Nathan Duran is a 53 y.o. male who presents to the Emergency Department complaining of unchanged, constant left shoulder and neck pain, that intermittently radiates down his left UE, with associated tingling along the ulnar distribution, onset 07/29/2013 after he fell and landed on his shoulder. He reports trouble sleeping at night secondary to his pain. He reports being seen here at ED, was given sling. He also went to [place] [facility] and was evaluated by Dr. Ophelia Charter who recommended an MRI, but he is having trouble getting his workers comp to approve this. He was prescribed Hydrocodone, that he has been taking as needed for the pain, without relief. He reports taking Neurontin in addition without relief. He reports seeing Dr. Ophelia Charter and Dr. Roda Shutters at [facility] orth[facility]. He denies currently having appointment with Dr. Ophelia Charter, but is trying to get one for next week.  He reports that currently his symptoms are unchanged from his chronic pain he just feels as if he "cannot take it anymore" and was unable to be seen by Dr. Ophelia Charter in the office today   PCP - Billee Cashing, MD  Past Medical History  Diagnosis Date  . Depression   . Hypertension   . Diabetes mellitus    Past Surgical History  Procedure Laterality Date  . Back surgery     History reviewed. No pertinent  family history. History  Substance Use Topics  . Smoking status: Never Smoker   . Smokeless tobacco: Not on file  . Alcohol Use: Yes    Review of Systems  Constitutional: Negative for fever and chills.  Gastrointestinal: Negative for nausea and vomiting.  Musculoskeletal: Positive for arthralgias (left shoulder) and neck pain. Negative for back pain, joint swelling and neck stiffness.  Skin: Negative for wound.  Neurological: Negative for weakness and numbness.  Hematological: Does not bruise/bleed easily.  Psychiatric/Behavioral: The patient is not nervous/anxious.   All other systems reviewed and are negative.   Allergies  Review of patient's allergies indicates no known allergies.  Home Medications   Current Outpatient Rx  Name  Route  Sig  Dispense  Refill  . lisinopril (PRINIVIL,ZESTRIL) 20 MG tablet   Oral   Take 20 mg by mouth daily.         . metFORMIN (GLUCOPHAGE) 1000 MG tablet   Oral   Take 1 tablet (1,000 mg total) by mouth 2 (two) times daily with a meal.   60 tablet   0   . oxyCODONE (ROXICODONE) 5 MG immediate release tablet   Oral   Take 1 tablet (5 mg total) by mouth every 8 (eight) hours as needed for breakthrough pain.   15 tablet   0    BP 147/89  Pulse 95  Temp(Src) 98.5 F (36.9 C) (Oral)  Resp 20  Wt 191 lb 1 oz (86.665 kg)  SpO2 98%  Physical Exam  Nursing note and vitals  reviewed. Constitutional: He appears well-developed and well-nourished. No distress.  HENT:  Head: Normocephalic and atraumatic.  Mouth/Throat: Oropharynx is clear and moist. No oropharyngeal exudate.  Eyes: Conjunctivae are normal.  Neck: Normal range of motion. Neck supple. Muscular tenderness ( Left-sided paraspinal) present. No spinous process tenderness present. No rigidity. Normal range of motion present.  Full ROM  Left-sided paraspinal tenderness, no midline tenderness  Cardiovascular: Normal rate, regular rhythm, normal heart sounds and intact distal  pulses.   No murmur heard. Capillary refill < 3 seconds.  Pulmonary/Chest: Effort normal and breath sounds normal. No respiratory distress. He has no wheezes.  Abdominal: Soft. He exhibits no distension. There is no tenderness.  Musculoskeletal: He exhibits tenderness. He exhibits no edema.  Full range of motion of the T-spine and L-spine No tenderness to palpation of the spinous processes of the T-spine or L-spine No tenderness to palpation of the paraspinous muscles of the L-spine  ROM: Limited ROM of left shoulder due to pain. Full ROM at left elbow left hand and fingers. No midline c-spine, t-spine, l-spine tenderness. Left sided paraspinal tenderness. Positive axial load test.   Lymphadenopathy:    He has no cervical adenopathy.  Neurological: He is alert. He has normal reflexes. Coordination normal.  Sensation normal to sharp and dull, but reported paresthesias in the ulnar distribution Strength 4/5 to left shoulder including resisted flexion and extension, abduction and adduction due to pain   Skin: Skin is warm and dry. No rash noted. He is not diaphoretic. No erythema.  No tenting of the skin  Psychiatric: He has a normal mood and affect. His behavior is normal.    ED Course  Procedures (including critical care time)  DIAGNOSTIC STUDIES: Oxygen Saturation is 98% on room air, normal by my interpretation.    COORDINATION OF CARE: 7:04 PM-Discussed treatment plan with pt at bedside and pt agreed to plan.   Labs Review Labs Reviewed - No data to display Imaging Review No results found.   EKG Interpretation None     Medications - No data to display MDM   Final diagnoses:  Arthralgia of shoulder region, left  Left cervical radiculopathy    Nathan Duran presents with chronic left shoulder pain being managed by [facility].  No new injuries and no new pain.  Patient with some cervical radiculopathy but no symptoms to indicate spinal cord impingement.  Patient is already taking hydrocodone and Neurontin. Patient reports he is just tired of the pain. He was unable to be seen by Dr. Kevan NyGates today. Will give prescription for oxycodone as needed for breakthrough pain. I have recommended further evaluation with orthopedics for continued pain management.  It has been determined that no acute conditions requiring further emergency intervention are present at this time. The patient/guardian have been advised of the diagnosis and plan. We have discussed signs and symptoms that warrant return to the ED, such as changes or worsening in symptoms.   Vital signs are stable at discharge.   BP 128/93  Pulse 89  Temp(Src) 97.9 F (36.6 C) (Oral)  Resp 16  Wt 191 lb 1 oz (86.665 kg)  SpO2 100%  Patient/guardian has voiced understanding and agreed to follow-up with the PCP or specialist.    I personally performed the services described in this documentation, which was scribed in my presence. The recorded information has been reviewed and is accurate.   Dierdre ForthHannah Tarrence Enck, PA-C 01/05/14 1919

## 2014-01-05 NOTE — Discharge Instructions (Signed)
1. Medications: oxycodone, usual home medications 2. Treatment: rest, drink plenty of fluids,  3. Follow Up: Please followup with Dr. Ophelia CharterYates for discussion of your diagnoses and further evaluation after today's visit;   Arthralgia Your caregiver has diagnosed you as suffering from an arthralgia. Arthralgia means there is pain in a joint. This can come from many reasons including:  Bruising the joint which causes soreness (inflammation) in the joint.  Wear and tear on the joints which occur as we grow older (osteoarthritis).  Overusing the joint.  Various forms of arthritis.  Infections of the joint. Regardless of the cause of pain in your joint, most of these different pains respond to anti-inflammatory drugs and rest. The exception to this is when a joint is infected, and these cases are treated with antibiotics, if it is a bacterial infection. HOME CARE INSTRUCTIONS   Rest the injured area for as long as directed by your caregiver. Then slowly start using the joint as directed by your caregiver and as the pain allows. Crutches as directed may be useful if the ankles, knees or hips are involved. If the knee was splinted or casted, continue use and care as directed. If an stretchy or elastic wrapping bandage has been applied today, it should be removed and re-applied every 3 to 4 hours. It should not be applied tightly, but firmly enough to keep swelling down. Watch toes and feet for swelling, bluish discoloration, coldness, numbness or excessive pain. If any of these problems (symptoms) occur, remove the ace bandage and re-apply more loosely. If these symptoms persist, contact your caregiver or return to this location.  For the first 24 hours, keep the injured extremity elevated on pillows while lying down.  Apply ice for 15-20 minutes to the sore joint every couple hours while awake for the first half day. Then 03-04 times per day for the first 48 hours. Put the ice in a plastic bag and  place a towel between the bag of ice and your skin.  Wear any splinting, casting, elastic bandage applications, or slings as instructed.  Only take over-the-counter or prescription medicines for pain, discomfort, or fever as directed by your caregiver. Do not use aspirin immediately after the injury unless instructed by your physician. Aspirin can cause increased bleeding and bruising of the tissues.  If you were given crutches, continue to use them as instructed and do not resume weight bearing on the sore joint until instructed. Persistent pain and inability to use the sore joint as directed for more than 2 to 3 days are warning signs indicating that you should see a caregiver for a follow-up visit as soon as possible. Initially, a hairline fracture (break in bone) may not be evident on X-rays. Persistent pain and swelling indicate that further evaluation, non-weight bearing or use of the joint (use of crutches or slings as instructed), or further X-rays are indicated. X-rays may sometimes not show a small fracture until a week or 10 days later. Make a follow-up appointment with your own caregiver or one to whom we have referred you. A radiologist (specialist in reading X-rays) may read your X-rays. Make sure you know how you are to obtain your X-ray results. Do not assume everything is normal if you do not hear from us. SEEK MEDICAL CARE IF: Bruising, swelling, or pain increases. SEEK IMMEDIATE MEDICAL CARE IF:   Your fingers or toes are numb or blue.  The pain is not responding to medications and continues to stay the same  or get worse.  The pain in your joint becomes severe.  You develop a fever over 102 F (38.9 C).  It becomes impossible to move or use the joint. MAKE SURE YOU:   Understand these instructions.  Will watch your condition.  Will get help right away if you are not doing well or get worse. Document Released: 09/28/2005 Document Revised: 12/21/2011 Document Reviewed:  05/16/2008 [facility] Patient Information 2014 [place], [place].

## 2014-01-07 NOTE — ED Provider Notes (Signed)
Medical screening examination/treatment/procedure(s) were performed by non-physician practitioner and as supervising physician I was immediately available for consultation/collaboration.   Dierre Crevier Y. Betta Balla, MD 01/07/14 0025 

## 2014-02-08 ENCOUNTER — Other Ambulatory Visit: Payer: Self-pay | Admitting: Orthopaedic Surgery

## 2014-02-08 DIAGNOSIS — M542 Cervicalgia: Secondary | ICD-10-CM

## 2014-02-09 ENCOUNTER — Ambulatory Visit
Admission: RE | Admit: 2014-02-09 | Discharge: 2014-02-09 | Disposition: A | Discharge status: Home / Self Care | Payer: BC Managed Care – PPO | Source: Ambulatory Visit | Attending: Orthopaedic Surgery | Admitting: Orthopaedic Surgery

## 2014-02-09 DIAGNOSIS — M542 Cervicalgia: Secondary | ICD-10-CM

## 2014-09-30 ENCOUNTER — Encounter (HOSPITAL_COMMUNITY): Payer: Self-pay | Admitting: *Deleted

## 2014-09-30 ENCOUNTER — Emergency Department (HOSPITAL_COMMUNITY): Payer: BC Managed Care – PPO

## 2014-09-30 ENCOUNTER — Emergency Department (HOSPITAL_COMMUNITY)
Admission: EM | Admit: 2014-09-30 | Discharge: 2014-09-30 | Disposition: A | Discharge status: Home / Self Care | Payer: BC Managed Care – PPO | Attending: Emergency Medicine | Admitting: Emergency Medicine

## 2014-09-30 DIAGNOSIS — R059 Cough, unspecified: Secondary | ICD-10-CM

## 2014-09-30 DIAGNOSIS — E119 Type 2 diabetes mellitus without complications: Secondary | ICD-10-CM | POA: Insufficient documentation

## 2014-09-30 DIAGNOSIS — Z792 Long term (current) use of antibiotics: Secondary | ICD-10-CM | POA: Insufficient documentation

## 2014-09-30 DIAGNOSIS — J069 Acute upper respiratory infection, unspecified: Secondary | ICD-10-CM | POA: Insufficient documentation

## 2014-09-30 DIAGNOSIS — R05 Cough: Secondary | ICD-10-CM

## 2014-09-30 DIAGNOSIS — Z79899 Other long term (current) drug therapy: Secondary | ICD-10-CM | POA: Insufficient documentation

## 2014-09-30 DIAGNOSIS — J0100 Acute maxillary sinusitis, unspecified: Secondary | ICD-10-CM

## 2014-09-30 DIAGNOSIS — Z7951 Long term (current) use of inhaled steroids: Secondary | ICD-10-CM | POA: Insufficient documentation

## 2014-09-30 DIAGNOSIS — Z8659 Personal history of other mental and behavioral disorders: Secondary | ICD-10-CM | POA: Insufficient documentation

## 2014-09-30 DIAGNOSIS — I10 Essential (primary) hypertension: Secondary | ICD-10-CM

## 2014-09-30 MED ORDER — AMOXICILLIN-POT CLAVULANATE 875-125 MG PO TABS: 1.0000 | ORAL_TABLET | Freq: Two times a day (BID) | ORAL | Status: DC

## 2014-09-30 MED ORDER — HYDROCODONE-HOMATROPINE 5-1.5 MG/5ML PO SYRP: 5.0000 mL | ORAL_SOLUTION | Freq: Four times a day (QID) | ORAL | Status: DC | PRN

## 2014-09-30 MED ORDER — FLUTICASONE PROPIONATE 50 MCG/ACT NA SUSP: 2.0000 | Freq: Every day | NASAL | Status: DC

## 2014-09-30 NOTE — ED Provider Notes (Signed)
CSN: [id number]     Arrival date & time 09/30/14  1832 History   First MD Initiated Contact with Patient 09/30/14 1927     Chief Complaint  Patient presents with  . URI     (Consider location/radiation/quality/duration/timing/severity/associated sxs/prior Treatment) Patient is a 53 y.o. male presenting with URI. The history is provided by the patient and medical records. No language interpreter was used.  URI Presenting symptoms: congestion, cough, rhinorrhea and sore throat   Presenting symptoms: no ear pain, no fatigue and no fever   Associated symptoms: headaches (frontal and behind the eyes)   Associated symptoms: no arthralgias, no myalgias and no wheezing     Nathan Duran is a 53 y.o. male  with a hx of HTN presents to the Emergency Department complaining of gradual, persistent, progressively worsening URI symptoms onset 2.5-3 weeks ago.  Pt reports that he has had nasal congestion, sinus pressure, frontal headache, sore throat, productive cough gradually worsening for the last 2 weeks.  Pt reports he has taken OTC cold medications with moderate relief, but symptoms have not resolved.  Patient denies fevers or chills, neck pain or neck stiffness, rash, abdominal pain, shortness of breath, wheezing, nausea or vomiting.  Nothing to make symptoms worse.  Past Medical History  Diagnosis Date  . Depression   . Hypertension   . Diabetes mellitus    Past Surgical History  Procedure Laterality Date  . Back surgery     History reviewed. No pertinent family history. History  Substance Use Topics  . Smoking status: Never Smoker   . Smokeless tobacco: Not on file  . Alcohol Use: Yes    Review of Systems  Constitutional: Negative for fever, chills, appetite change and fatigue.  HENT: Positive for congestion, postnasal drip, rhinorrhea, sinus pressure and sore throat. Negative for ear discharge, ear pain and mouth sores.   Eyes: Negative for visual disturbance.  Respiratory:  Positive for cough. Negative for chest tightness, shortness of breath, wheezing and stridor.   Cardiovascular: Negative for chest pain, palpitations and leg swelling.  Gastrointestinal: Negative for nausea, vomiting, abdominal pain and diarrhea.  Genitourinary: Negative for dysuria, urgency, frequency and hematuria.  Musculoskeletal: Negative for myalgias, back pain, arthralgias and neck stiffness.  Skin: Negative for rash.  Neurological: Positive for headaches (frontal and behind the eyes). Negative for syncope, light-headedness and numbness.  Hematological: Negative for adenopathy.  Psychiatric/Behavioral: The patient is not nervous/anxious.   All other systems reviewed and are negative.     Allergies  Review of patient's allergies indicates no known allergies.  Home Medications   Prior to Admission medications   Medication Sig Start Date End Date Taking? Authorizing Provider  amoxicillin-clavulanate (AUGMENTIN) 875-125 MG per tablet Take 1 tablet by mouth every 12 (twelve) hours. 09/30/14   Maurie Olesen, PA-C  fluticasone (FLONASE) 50 MCG/ACT nasal spray Place 2 sprays into both nostrils daily. 09/30/14   Lequan Dobratz, PA-C  HYDROcodone-homatropine (HYCODAN) 5-1.5 MG/5ML syrup Take 5 mLs by mouth every 6 (six) hours as needed for cough. 09/30/14   Nena Hampe, PA-C  lisinopril (PRINIVIL,ZESTRIL) 20 MG tablet Take 20 mg by mouth daily.    Historical Provider, MD  metFORMIN (GLUCOPHAGE) 1000 MG tablet Take 1 tablet (1,000 mg total) by mouth 2 (two) times daily with a meal. 04/02/13   Juliet RudeNathan R. Pickering, MD  oxyCODONE (ROXICODONE) 5 MG immediate release tablet Take 1 tablet (5 mg total) by mouth every 8 (eight) hours as needed for breakthrough pain. 01/05/14  Aspyn Warnke, PA-C   BP 151/100 mmHg  Pulse 103  Temp(Src) 98 F (36.7 C) (Oral)  Resp 12  Ht 5\' 8"  (1.727 m)  Wt 170 lb (77.111 kg)  BMI 25.85 kg/m2  SpO2 100% Physical Exam  Constitutional: He  is oriented to person, place, and time. He appears well-developed and well-nourished. No distress.  HENT:  Head: Normocephalic and atraumatic.  Right Ear: Tympanic membrane, external ear and ear canal normal.  Left Ear: Tympanic membrane, external ear and ear canal normal.  Nose: Mucosal edema, rhinorrhea and sinus tenderness present. No epistaxis. Right sinus exhibits maxillary sinus tenderness. Right sinus exhibits no frontal sinus tenderness. Left sinus exhibits maxillary sinus tenderness. Left sinus exhibits no frontal sinus tenderness.  Mouth/Throat: Uvula is midline, oropharynx is clear and moist and mucous membranes are normal. Mucous membranes are not pale and not cyanotic. No oropharyngeal exudate, posterior oropharyngeal edema, posterior oropharyngeal erythema or tonsillar abscesses.  Bilateral maxillary sinus pain  Eyes: Conjunctivae are normal. Pupils are equal, round, and reactive to light.  Neck: Normal range of motion and full passive range of motion without pain.  Cardiovascular: Normal rate, regular rhythm, normal heart sounds and intact distal pulses.   No murmur heard. Pulmonary/Chest: Effort normal and breath sounds normal. No stridor.  Clear and equal breath sounds without focal wheezes, rhonchi, rales Pt coughing during exam  Abdominal: Soft. Bowel sounds are normal. There is no tenderness.  Musculoskeletal: Normal range of motion.  Lymphadenopathy:    He has no cervical adenopathy.  Neurological: He is alert and oriented to person, place, and time.  Skin: Skin is warm and dry. No rash noted. He is not diaphoretic. No erythema.  Psychiatric: He has a normal mood and affect.  Nursing note and vitals reviewed.   ED Course  Procedures (including critical care time) Labs Review Labs Reviewed - No data to display  Imaging Review Dg Chest 2 View  09/30/2014   CLINICAL DATA:  Upper respiratory infection. Symptoms for 3 weeks with sneezing, coughing and runny nose.  History of hypertension and diabetes. Initial encounter.  EXAM: CHEST  2 VIEW  COMPARISON:  04/02/2013 radiographs.  FINDINGS: The heart size and mediastinal contours are normal. The lungs are clear. There is no pleural effusion or pneumothorax. No acute osseous findings are identified.  IMPRESSION: Stable examination.  No active cardiopulmonary process.   Electronically Signed   By: Roxy Horseman M.D.   On: 09/30/2014 20:02     EKG Interpretation None      MDM   Final diagnoses:  URI (upper respiratory infection)  Acute maxillary sinusitis, recurrence not specified  Cough  Essential hypertension   Nathan Medal presents with URI symptoms x 2.5 weeks.  Pt CXR negative for acute infiltrate. Patients symptoms are consistent with URI, likely viral etiology. Patient complaining of symptoms of sinusitis.  Severe symptoms have been present for greater than 10 days with nasal discharge and maxillary sinus pain.  Concern for acute bacterial rhinosinusitis.  Patient discharged with Augmentin.  Instructions given for warm saline nasal wash and recommendations for follow-up with primary care physician.  Pt will also be discharged with symptomatic treatment.  Verbalizes understanding and is agreeable with plan. Pt is hemodynamically stable & in NAD prior to dc.  I have personally reviewed patient's vitals, nursing note and any pertinent labs or imaging.  I performed an focused physical exam; undressed when appropriate .    It has been determined that no acute conditions requiring  further emergency intervention are present at this time. The patient/guardian have been advised of the diagnosis and plan. I reviewed any labs and imaging including any potential incidental findings. We have discussed signs and symptoms that warrant return to the ED and they are listed in the discharge instructions.    Vital signs are stable at discharge.   BP 151/100 mmHg  Pulse 103  Temp(Src) 98 F (36.7 C) (Oral)  Resp  12  Ht 5\' 8"  (1.727 m)  Wt 170 lb (77.111 kg)  BMI 25.85 kg/m2  SpO2 100%          Dierdre ForthHannah Shanise Balch, PA-C 10/01/14 [id number]  Arby BarretteMarcy Pfeiffer, MD 10/01/14 2310

## 2014-09-30 NOTE — ED Notes (Signed)
Pt reports having cold symptoms x 3 weeks. Having congestion, productive cough and sneezing. No acute distress noted at triage.

## 2014-09-30 NOTE — Discharge Instructions (Signed)
1. Medications: flonase, Augmentin, hycodan, mucinex, usual home medications 2. Treatment: rest, drink plenty of fluids, take tylenol or ibuprofen for fever control 3. Follow Up: Please followup with your primary doctor in 3 days for discussion of your diagnoses and further evaluation after today's visit; if you do not have a primary care doctor use the resource guide provided to find one; Return to the ER for high fevers, difficulty breathing or other concerning symptoms    Sinusitis Sinusitis is redness, soreness, and inflammation of the paranasal sinuses. Paranasal sinuses are air pockets within the bones of your face (beneath the eyes, the middle of the forehead, or above the eyes). In healthy paranasal sinuses, mucus is able to drain out, and air is able to circulate through them by way of your nose. However, when your paranasal sinuses are inflamed, mucus and air can become trapped. This can allow bacteria and other germs to grow and cause infection. Sinusitis can develop quickly and last only a short time (acute) or continue over a long period (chronic). Sinusitis that lasts for more than 12 weeks is considered chronic.  CAUSES  Causes of sinusitis include:  Allergies.  Structural abnormalities, such as displacement of the cartilage that separates your nostrils (deviated septum), which can decrease the air flow through your nose and sinuses and affect sinus drainage.  Functional abnormalities, such as when the small hairs (cilia) that line your sinuses and help remove mucus do not work properly or are not present. SIGNS AND SYMPTOMS  Symptoms of acute and chronic sinusitis are the same. The primary symptoms are pain and pressure around the affected sinuses. Other symptoms include:  Upper toothache.  Earache.  Headache.  Bad breath.  Decreased sense of smell and taste.  A cough, which worsens when you are lying flat.  Fatigue.  Fever.  Thick drainage from your nose, which  often is green and may contain pus (purulent).  Swelling and warmth over the affected sinuses. DIAGNOSIS  Your health care provider will perform a physical exam. During the exam, your health care provider may:  Look in your nose for signs of abnormal growths in your nostrils (nasal polyps).  Tap over the affected sinus to check for signs of infection.  View the inside of your sinuses (endoscopy) using an imaging device that has a light attached (endoscope). If your health care provider suspects that you have chronic sinusitis, one or more of the following tests may be recommended:  Allergy tests.  Nasal culture. A sample of mucus is taken from your nose, sent to a lab, and screened for bacteria.  Nasal cytology. A sample of mucus is taken from your nose and examined by your health care provider to determine if your sinusitis is related to an allergy. TREATMENT  Most cases of acute sinusitis are related to a viral infection and will resolve on their own within 10 days. Sometimes medicines are prescribed to help relieve symptoms (pain medicine, decongestants, nasal steroid sprays, or saline sprays).  However, for sinusitis related to a bacterial infection, your health care provider will prescribe antibiotic medicines. These are medicines that will help kill the bacteria causing the infection.  Rarely, sinusitis is caused by a fungal infection. In theses cases, your health care provider will prescribe antifungal medicine. For some cases of chronic sinusitis, surgery is needed. Generally, these are cases in which sinusitis recurs more than 3 times per year, despite other treatments. HOME CARE INSTRUCTIONS   Drink plenty of water. Water helps thin  the mucus so your sinuses can drain more easily.  Use a humidifier.  Inhale steam 3 to 4 times a day (for example, sit in the bathroom with the shower running).  Apply a warm, moist washcloth to your face 3 to 4 times a day, or as directed by your  health care provider.  Use saline nasal sprays to help moisten and clean your sinuses.  Take medicines only as directed by your health care provider.  If you were prescribed either an antibiotic or antifungal medicine, finish it all even if you start to feel better. SEEK IMMEDIATE MEDICAL CARE IF:  You have increasing pain or severe headaches.  You have nausea, vomiting, or drowsiness.  You have swelling around your face.  You have vision problems.  You have a stiff neck.  You have difficulty breathing. MAKE SURE YOU:   Understand these instructions.  Will watch your condition.  Will get help right away if you are not doing well or get worse. Document Released: 09/28/2005 Document Revised: 02/12/2014 Document Reviewed: 10/13/2011 [facility] Patient Information 2015 [place], [place]. This information is not intended to replace advice given to you by your health care provider. Make sure you discuss any questions you have with your health care provider.

## 2015-09-23 ENCOUNTER — Encounter (HOSPITAL_COMMUNITY): Payer: Self-pay | Admitting: Nurse Practitioner

## 2015-09-23 ENCOUNTER — Emergency Department (HOSPITAL_COMMUNITY)
Admission: EM | Admit: 2015-09-23 | Discharge: 2015-09-23 | Disposition: A | Discharge status: Home / Self Care | Payer: Self-pay | Attending: Emergency Medicine | Admitting: Emergency Medicine

## 2015-09-23 ENCOUNTER — Emergency Department (HOSPITAL_COMMUNITY): Payer: Self-pay

## 2015-09-23 DIAGNOSIS — Z79899 Other long term (current) drug therapy: Secondary | ICD-10-CM | POA: Insufficient documentation

## 2015-09-23 DIAGNOSIS — Z7984 Long term (current) use of oral hypoglycemic drugs: Secondary | ICD-10-CM | POA: Insufficient documentation

## 2015-09-23 DIAGNOSIS — Z8659 Personal history of other mental and behavioral disorders: Secondary | ICD-10-CM | POA: Insufficient documentation

## 2015-09-23 DIAGNOSIS — Z7951 Long term (current) use of inhaled steroids: Secondary | ICD-10-CM | POA: Insufficient documentation

## 2015-09-23 DIAGNOSIS — E119 Type 2 diabetes mellitus without complications: Secondary | ICD-10-CM | POA: Insufficient documentation

## 2015-09-23 DIAGNOSIS — M791 Myalgia: Secondary | ICD-10-CM | POA: Insufficient documentation

## 2015-09-23 DIAGNOSIS — B9789 Other viral agents as the cause of diseases classified elsewhere: Secondary | ICD-10-CM

## 2015-09-23 DIAGNOSIS — J069 Acute upper respiratory infection, unspecified: Secondary | ICD-10-CM | POA: Insufficient documentation

## 2015-09-23 DIAGNOSIS — I10 Essential (primary) hypertension: Secondary | ICD-10-CM | POA: Insufficient documentation

## 2015-09-23 MED ORDER — IBUPROFEN 400 MG PO TABS
600.0000 mg | ORAL_TABLET | Freq: Once | ORAL | Status: AC
  Administered 2015-09-23: 600 mg via ORAL
  Filled 2015-09-23: qty 1

## 2015-09-23 MED ORDER — HYDROCODONE-HOMATROPINE 5-1.5 MG/5ML PO SYRP: 5.0000 mL | ORAL_SOLUTION | Freq: Four times a day (QID) | ORAL | Status: DC | PRN

## 2015-09-23 MED ORDER — HYDROCODONE-HOMATROPINE 5-1.5 MG/5ML PO SYRP
5.0000 mL | ORAL_SOLUTION | Freq: Once | ORAL | Status: AC
Start: 2015-09-23 — End: 2015-09-23
  Administered 2015-09-23: 5 mL via ORAL
  Filled 2015-09-23: qty 5

## 2015-09-23 NOTE — ED Provider Notes (Signed)
CSN: [id number]     Arrival date & time 09/23/15  1729 History  By signing my name below, I, Tanda Rockers, attest that this documentation has been prepared under the direction and in the presence of [employer], PA-C. Electronically Signed: Tanda Rockers, ED Scribe. 09/23/2015. 6:47 PM.    Chief Complaint  Patient presents with  . URI   The history is provided by the patient. No language interpreter was used.     HPI Comments: Nathan Duran is a 54 y.o. male with hx HTN and DM who presents to the Emergency Department complaining of gradual onset, constant, URI symptoms including productive cough with yellowish brown phlegm, myalgias, rhinorrhea, congestion, chest pain and sore throat with coughing, and shortness of breath. Pt has taken Coricidin, OTC allergy medication, and Flonase without relief. Recent sick contact with similar symptoms including his kids and coworkers. Denies fever, chills, or any other associated symptoms.   Past Medical History  Diagnosis Date  . Depression   . Hypertension   . Diabetes mellitus    Past Surgical History  Procedure Laterality Date  . Back surgery     History reviewed. No pertinent family history. Social History  Substance Use Topics  . Smoking status: Never Smoker   . Smokeless tobacco: None  . Alcohol Use: Yes    Review of Systems  Constitutional: Negative for fever and chills.  HENT: Positive for congestion, rhinorrhea and sore throat (With coughing).   Respiratory: Positive for cough and shortness of breath.   Cardiovascular: Positive for chest pain (With coughing).  Musculoskeletal: Positive for myalgias.  All other systems reviewed and are negative.  Allergies  Review of patient's allergies indicates no known allergies.  Home Medications   Prior to Admission medications   Medication Sig Start Date End Date Taking? Authorizing Provider  amoxicillin-clavulanate (AUGMENTIN) 875-125 MG per tablet Take 1 tablet by mouth  every 12 (twelve) hours. 09/30/14   Hannah Muthersbaugh, PA-C  fluticasone (FLONASE) 50 MCG/ACT nasal spray Place 2 sprays into both nostrils daily. 09/30/14   Hannah Muthersbaugh, PA-C  HYDROcodone-homatropine (HYCODAN) 5-1.5 MG/5ML syrup Take 5 mLs by mouth every 6 (six) hours as needed for cough. 09/23/15   Shavonte Zhao Patel-Mills, PA-C  lisinopril (PRINIVIL,ZESTRIL) 20 MG tablet Take 20 mg by mouth daily.    Historical Provider, MD  metFORMIN (GLUCOPHAGE) 1000 MG tablet Take 1 tablet (1,000 mg total) by mouth 2 (two) times daily with a meal. 04/02/13   [name], MD  oxyCODONE (ROXICODONE) 5 MG immediate release tablet Take 1 tablet (5 mg total) by mouth every 8 (eight) hours as needed for breakthrough pain. 01/05/14   Hannah Muthersbaugh, PA-C   Triage Vitals: BP 132/101 mmHg  Pulse 114  Temp(Src) 99.7 F (37.6 C) (Oral)  Resp 18  Ht  (1.727 m)  Wt 162 lb 1.6 oz (73.528 kg)  BMI 24.65 kg/m2  SpO2 98%   Physical Exam  Constitutional: He is oriented to person, place, and time. He appears well-developed and well-nourished. No distress.  HENT:  Head: Normocephalic and atraumatic.  No oropharyngeal or tonsillar exudates, edema, or erythema  Eyes: Conjunctivae and EOM are normal.  Neck: Neck supple. No tracheal deviation present.  Cardiovascular: Normal rate and regular rhythm.   Pulmonary/Chest: Effort normal. No respiratory distress. He has no wheezes. He has rhonchi. He has no rales.  Lungs clear to auscultation bilaterally. No respiratory distress. Speaking full sentences.  Abdominal: Soft. There is no tenderness.  Musculoskeletal: Normal range of  motion.  Neurological: He is alert and oriented to person, place, and time.  Skin: Skin is warm and dry.  Psychiatric: He has a normal mood and affect. His behavior is normal.  Nursing note and vitals reviewed.   ED Course  Procedures (including critical care time)  DIAGNOSTIC STUDIES: Oxygen Saturation is 98% on RA, normal by  my interpretation.    COORDINATION OF CARE: 6:42 PM-Discussed treatment plan which includes Rx Hycodan with pt at bedside and pt agreed to plan.   Labs Review Labs Reviewed - No data to display  Imaging Review Dg Chest 2 View  09/23/2015  CLINICAL DATA:  Cough, congestion, and chest tightness for 6 days. History of hypertension, diabetes, nonsmoker. EXAM: CHEST  2 VIEW COMPARISON:  09/30/2014 FINDINGS: The heart size and mediastinal contours are within normal limits. Both lungs are clear. The visualized skeletal structures are unremarkable. IMPRESSION: No active cardiopulmonary disease. Electronically Signed   By: Burman NievesWilliam  Stevens M.D.   On: 09/23/2015 19:23   I have personally reviewed and evaluated these images as part of my medical decision-making.   EKG Interpretation None      MDM   Final diagnoses:  Viral URI with cough  Pt presents to the ED for productive cough, body aches, rhinorrhea, and congestion x 2 days. Pt symptoms consistent with URI. CXR negative for acute infiltrate. Pt will be discharged with symptomatic treatment.  Discussed return precautions.  Pt is hemodynamically stable & in NAD prior to discharge. Medications  HYDROcodone-homatropine (HYCODAN) 5-1.5 MG/5ML syrup 5 mL (5 mLs Oral Given 09/23/15 1851)  ibuprofen (ADVIL,MOTRIN) tablet 600 mg (600 mg Oral Given 09/23/15 1851)   Filed Vitals:   09/23/15 1813 09/23/15 1946  BP: 132/101 128/89  Pulse: 114 98  Temp: 99.7 F (37.6 C)   Resp: 18 16   I personally performed the services described in this documentation, which was scribed in my presence. The recorded information has been reviewed and is accurate.      Catha GosselinHanna Patel-Mills, PA-C 09/23/15 2223  Gwyneth SproutWhitney Plunkett, MD 09/25/15 2158

## 2015-09-23 NOTE — Discharge Instructions (Signed)
Upper Respiratory Infection, Adult Follow-up with your primary care provider in 48 hours. Return for fever, shortness of breath or worsening cough. Most upper respiratory infections (URIs) are a viral infection of the air passages leading to the lungs. A URI affects the nose, throat, and upper air passages. The most common type of URI is nasopharyngitis and is typically referred to as "the common cold." URIs run their course and usually go away on their own. Most of the time, a URI does not require medical attention, but sometimes a bacterial infection in the upper airways can follow a viral infection. This is called a secondary infection. Sinus and middle ear infections are common types of secondary upper respiratory infections. Bacterial pneumonia can also complicate a URI. A URI can worsen asthma and chronic obstructive pulmonary disease (COPD). Sometimes, these complications can require emergency medical care and may be life threatening.  CAUSES Almost all URIs are caused by viruses. A virus is a type of germ and can spread from one person to another.  RISKS FACTORS You may be at risk for a URI if:   You smoke.   You have chronic heart or lung disease.  You have a weakened defense (immune) system.   You are very young or very old.   You have nasal allergies or asthma.  You work in crowded or poorly ventilated areas.  You work in health care facilities or schools. SIGNS AND SYMPTOMS  Symptoms typically develop 2-3 days after you come in contact with a cold virus. Most viral URIs last 7-10 days. However, viral URIs from the influenza virus (flu virus) can last 14-18 days and are typically more severe. Symptoms may include:   Runny or stuffy (congested) nose.   Sneezing.   Cough.   Sore throat.   Headache.   Fatigue.   Fever.   Loss of appetite.   Pain in your forehead, behind your eyes, and over your cheekbones (sinus pain).  Muscle aches.  DIAGNOSIS  Your  health care provider may diagnose a URI by:  Physical exam.  Tests to check that your symptoms are not due to another condition such as:  Strep throat.  Sinusitis.  Pneumonia.  Asthma. TREATMENT  A URI goes away on its own with time. It cannot be cured with medicines, but medicines may be prescribed or recommended to relieve symptoms. Medicines may help:  Reduce your fever.  Reduce your cough.  Relieve nasal congestion. HOME CARE INSTRUCTIONS   Take medicines only as directed by your health care provider.   Gargle warm saltwater or take cough drops to comfort your throat as directed by your health care provider.  Use a warm mist humidifier or inhale steam from a shower to increase air moisture. This may make it easier to breathe.  Drink enough fluid to keep your urine clear or pale yellow.   Eat soups and other clear broths and maintain good nutrition.   Rest as needed.   Return to work when your temperature has returned to normal or as your health care provider advises. You may need to stay home longer to avoid infecting others. You can also use a face mask and careful hand washing to prevent spread of the virus.  Increase the usage of your inhaler if you have asthma.   Do not use any tobacco products, including cigarettes, chewing tobacco, or electronic cigarettes. If you need help quitting, ask your health care provider. PREVENTION  The best way to protect yourself from getting  a cold is to practice good hygiene.   Avoid oral or hand contact with people with cold symptoms.   Wash your hands often if contact occurs.  There is no clear evidence that vitamin C, vitamin E, echinacea, or exercise reduces the chance of developing a cold. However, it is always recommended to get plenty of rest, exercise, and practice good nutrition.  SEEK MEDICAL CARE IF:   You are getting worse rather than better.   Your symptoms are not controlled by medicine.   You have  chills.  You have worsening shortness of breath.  You have brown or red mucus.  You have yellow or brown nasal discharge.  You have pain in your face, especially when you bend forward.  You have a fever.  You have swollen neck glands.  You have pain while swallowing.  You have white areas in the back of your throat. SEEK IMMEDIATE MEDICAL CARE IF:   You have severe or persistent:  Headache.  Ear pain.  Sinus pain.  Chest pain.  You have chronic lung disease and any of the following:  Wheezing.  Prolonged cough.  Coughing up blood.  A change in your usual mucus.  You have a stiff neck.  You have changes in your:  Vision.  Hearing.  Thinking.  Mood. MAKE SURE YOU:   Understand these instructions.  Will watch your condition.  Will get help right away if you are not doing well or get worse.   This information is not intended to replace advice given to you by your health care provider. Make sure you discuss any questions you have with your health care provider.   Document Released: 03/24/2001 Document Revised: 02/12/2015 Document Reviewed: 01/03/2014 Elsevier Interactive Patient Education [employer]2016 [employer].

## 2015-09-23 NOTE — ED Notes (Signed)
C/o cough, runny nose, congestion, body aches x 5 days. Denies fevers. Tried coricidin, OTC allergy meds, flonase with no relief

## 2015-11-14 ENCOUNTER — Emergency Department (HOSPITAL_COMMUNITY): Payer: Self-pay

## 2015-11-14 ENCOUNTER — Encounter (HOSPITAL_COMMUNITY): Payer: Self-pay | Admitting: *Deleted

## 2015-11-14 ENCOUNTER — Emergency Department (HOSPITAL_COMMUNITY)
Admission: EM | Admit: 2015-11-14 | Discharge: 2015-11-14 | Disposition: A | Discharge status: Home / Self Care | Payer: Self-pay | Attending: Emergency Medicine | Admitting: Emergency Medicine

## 2015-11-14 DIAGNOSIS — Z7951 Long term (current) use of inhaled steroids: Secondary | ICD-10-CM | POA: Insufficient documentation

## 2015-11-14 DIAGNOSIS — Z7984 Long term (current) use of oral hypoglycemic drugs: Secondary | ICD-10-CM | POA: Insufficient documentation

## 2015-11-14 DIAGNOSIS — J069 Acute upper respiratory infection, unspecified: Secondary | ICD-10-CM | POA: Insufficient documentation

## 2015-11-14 DIAGNOSIS — Z792 Long term (current) use of antibiotics: Secondary | ICD-10-CM | POA: Insufficient documentation

## 2015-11-14 DIAGNOSIS — Z79899 Other long term (current) drug therapy: Secondary | ICD-10-CM | POA: Insufficient documentation

## 2015-11-14 DIAGNOSIS — E119 Type 2 diabetes mellitus without complications: Secondary | ICD-10-CM | POA: Insufficient documentation

## 2015-11-14 DIAGNOSIS — Z8659 Personal history of other mental and behavioral disorders: Secondary | ICD-10-CM | POA: Insufficient documentation

## 2015-11-14 DIAGNOSIS — I10 Essential (primary) hypertension: Secondary | ICD-10-CM | POA: Insufficient documentation

## 2015-11-14 MED ORDER — AMOXICILLIN 500 MG PO CAPS: 1000.0000 mg | ORAL_CAPSULE | Freq: Two times a day (BID) | ORAL | Status: DC

## 2015-11-14 MED ORDER — AMOXICILLIN 500 MG PO CAPS
1000.0000 mg | ORAL_CAPSULE | Freq: Once | ORAL | Status: AC
  Administered 2015-11-14: 1000 mg via ORAL
  Filled 2015-11-14: qty 2

## 2015-11-14 NOTE — ED Notes (Signed)
States he was here a few weeks ago and diagnosed with URI. States hat he has had a head cold, cold chills and now coughing.

## 2015-11-14 NOTE — ED Provider Notes (Signed)
CSN: [id number]     Arrival date & time 11/14/15  1817 History   First MD Initiated Contact with Patient 11/14/15 2136     Chief Complaint  Patient presents with  . URI     (Consider location/radiation/quality/duration/timing/severity/associated sxs/prior Treatment) Patient is a 55 y.o. male presenting with URI. The history is provided by the patient.  URI He complains of nasal congestion and chest congestion for about the last week. He is complaining of pain across his forehead. He has brownish mucus when he blows his nose and cough is productive of brownish sputum. He has not had any fever or chills but has had some night sweats. There's been no nausea or vomiting. No arthralgias or myalgias. He been seen in the ED for similar illness about 3 weeks ago and was diagnosed with viral upper respiratory infection. He has not done anything to treat his current symptoms.  Past Medical History  Diagnosis Date  . Depression   . Hypertension   . Diabetes mellitus    Past Surgical History  Procedure Laterality Date  . Back surgery     No family history on file. Social History  Substance Use Topics  . Smoking status: Never Smoker   . Smokeless tobacco: None  . Alcohol Use: Yes     Comment: 2-3 cans beer/night    Review of Systems  All other systems reviewed and are negative.     Allergies  Review of patient's allergies indicates no known allergies.  Home Medications   Prior to Admission medications   Medication Sig Start Date End Date Taking? Authorizing Provider  amoxicillin-clavulanate (AUGMENTIN) 875-125 MG per tablet Take 1 tablet by mouth every 12 (twelve) hours. 09/30/14   Hannah Muthersbaugh, PA-C  fluticasone (FLONASE) 50 MCG/ACT nasal spray Place 2 sprays into both nostrils daily. 09/30/14   Hannah Muthersbaugh, PA-C  HYDROcodone-homatropine (HYCODAN) 5-1.5 MG/5ML syrup Take 5 mLs by mouth every 6 (six) hours as needed for cough. 09/23/15   Hanna Patel-Mills, PA-C   lisinopril (PRINIVIL,ZESTRIL) 20 MG tablet Take 20 mg by mouth daily.    Historical Provider, MD  metFORMIN (GLUCOPHAGE) 1000 MG tablet Take 1 tablet (1,000 mg total) by mouth 2 (two) times daily with a meal. 04/02/13   [name], MD  oxyCODONE (ROXICODONE) 5 MG immediate release tablet Take 1 tablet (5 mg total) by mouth every 8 (eight) hours as needed for breakthrough pain. 01/05/14   Hannah Muthersbaugh, PA-C   BP 155/107 mmHg  Pulse 66  Temp(Src) 98.1 F (36.7 C) (Oral)  Resp 18  Ht  (1.727 m)  Wt 170 lb (77.111 kg)  BMI 25.85 kg/m2  SpO2 100% Physical Exam  Nursing note and vitals reviewed.  55 year old male, resting comfortably and in no acute distress. Vital signs are significant for hypertension. Oxygen saturation is 100%, which is normal. Head is normocephalic and atraumatic. PERRLA, EOMI. Oropharynx is clear. There is mild tenderness palpation over maxillary sinuses. Neck is nontender and supple without adenopathy or JVD. Back is nontender and there is no CVA tenderness. Lungs are clear without rales, wheezes, or rhonchi. Chest is nontender. Heart has regular rate and rhythm without murmur. Abdomen is soft, flat, nontender without masses or hepatosplenomegaly and peristalsis is normoactive. Extremities have no cyanosis or edema, full range of motion is present. Skin is warm and dry without rash. Neurologic: Mental status is normal, cranial nerves are intact, there are no motor or sensory deficits.  ED Course  Procedures (including critical  care time)  Imaging Review Dg Chest 2 View  11/14/2015  CLINICAL DATA:  Cough today. EXAM: CHEST  2 VIEW COMPARISON:  09/23/2015 FINDINGS: The cardiomediastinal contours are normal. The lungs are clear. Pulmonary vasculature is normal. No consolidation, pleural effusion, or pneumothorax. No acute osseous abnormalities are seen. IMPRESSION: No acute pulmonary process. Electronically Signed   By: Rubye Oaks M.D.   On:  11/14/2015 22:13   I have personally reviewed and evaluated these images as part of my medical decision-making.    MDM   Final diagnoses:  Upper respiratory infection    Respiratory tract infection with probable components of sinusitis and bronchitis. He'll be sent for chest x-ray to rule out pneumonia. Old records are reviewed confirming recent ED visit with diagnosis of viral upper respiratory infection. At that time, chest x-ray was clear.  Chest x-ray shows no evidence of pneumonia. However, I am concerned that this is a bacterial infection with some evidence of early sinusitis. He is discharged with prescription for amoxicillin.  Dione Booze, MD 11/14/15 [phone]

## 2015-11-14 NOTE — Discharge Instructions (Signed)
Upper Respiratory Infection, Adult Most upper respiratory infections (URIs) are a viral infection of the air passages leading to the lungs. A URI affects the nose, throat, and upper air passages. The most common type of URI is nasopharyngitis and is typically referred to as "the common cold." URIs run their course and usually go away on their own. Most of the time, a URI does not require medical attention, but sometimes a bacterial infection in the upper airways can follow a viral infection. This is called a secondary infection. Sinus and middle ear infections are common types of secondary upper respiratory infections. Bacterial pneumonia can also complicate a URI. A URI can worsen asthma and chronic obstructive pulmonary disease (COPD). Sometimes, these complications can require emergency medical care and may be life threatening.  CAUSES Almost all URIs are caused by viruses. A virus is a type of germ and can spread from one person to another.  RISKS FACTORS You may be at risk for a URI if:   You smoke.   You have chronic heart or lung disease.  You have a weakened defense (immune) system.   You are very young or very old.   You have nasal allergies or asthma.  You work in crowded or poorly ventilated areas.  You work in health care facilities or schools. SIGNS AND SYMPTOMS  Symptoms typically develop 2-3 days after you come in contact with a cold virus. Most viral URIs last 7-10 days. However, viral URIs from the influenza virus (flu virus) can last 14-18 days and are typically more severe. Symptoms may include:   Runny or stuffy (congested) nose.   Sneezing.   Cough.   Sore throat.   Headache.   Fatigue.   Fever.   Loss of appetite.   Pain in your forehead, behind your eyes, and over your cheekbones (sinus pain).  Muscle aches.  DIAGNOSIS  Your health care provider may diagnose a URI by:  Physical exam.  Tests to check that your symptoms are not due to  another condition such as:  Strep throat.  Sinusitis.  Pneumonia.  Asthma. TREATMENT  A URI goes away on its own with time. It cannot be cured with medicines, but medicines may be prescribed or recommended to relieve symptoms. Medicines may help:  Reduce your fever.  Reduce your cough.  Relieve nasal congestion. HOME CARE INSTRUCTIONS   Take medicines only as directed by your health care provider.   Gargle warm saltwater or take cough drops to comfort your throat as directed by your health care provider.  Use a warm mist humidifier or inhale steam from a shower to increase air moisture. This may make it easier to breathe.  Drink enough fluid to keep your urine clear or pale yellow.   Eat soups and other clear broths and maintain good nutrition.   Rest as needed.   Return to work when your temperature has returned to normal or as your health care provider advises. You may need to stay home longer to avoid infecting others. You can also use a face mask and careful hand washing to prevent spread of the virus.  Increase the usage of your inhaler if you have asthma.   Do not use any tobacco products, including cigarettes, chewing tobacco, or electronic cigarettes. If you need help quitting, ask your health care provider. PREVENTION  The best way to protect yourself from getting a cold is to practice good hygiene.   Avoid oral or hand contact with people with cold  symptoms.   Wash your hands often if contact occurs.  There is no clear evidence that vitamin C, vitamin E, echinacea, or exercise reduces the chance of developing a cold. However, it is always recommended to get plenty of rest, exercise, and practice good nutrition.  SEEK MEDICAL CARE IF:   You are getting worse rather than better.   Your symptoms are not controlled by medicine.   You have chills.  You have worsening shortness of breath.  You have brown or red mucus.  You have yellow or brown nasal  discharge.  You have pain in your face, especially when you bend forward.  You have a fever.  You have swollen neck glands.  You have pain while swallowing.  You have white areas in the back of your throat. SEEK IMMEDIATE MEDICAL CARE IF:   You have severe or persistent:  Headache.  Ear pain.  Sinus pain.  Chest pain.  You have chronic lung disease and any of the following:  Wheezing.  Prolonged cough.  Coughing up blood.  A change in your usual mucus.  You have a stiff neck.  You have changes in your:  Vision.  Hearing.  Thinking.  Mood. MAKE SURE YOU:   Understand these instructions.  Will watch your condition.  Will get help right away if you are not doing well or get worse.   This information is not intended to replace advice given to you by your health care provider. Make sure you discuss any questions you have with your health care provider.   Document Released: 03/24/2001 Document Revised: 02/12/2015 Document Reviewed: 01/03/2014 Elsevier Interactive Patient Education 2016 [employer]  Amoxicillin capsules or tablets What is this medicine? AMOXICILLIN (a mox i SIL in) is a penicillin antibiotic. It is used to treat certain kinds of bacterial infections. It will not work for colds, flu, or other viral infections. This medicine may be used for other purposes; ask your health care provider or pharmacist if you have questions. What should I tell my health care provider before I take this medicine? They need to know if you have any of these conditions: -asthma -kidney disease -an unusual or allergic reaction to amoxicillin, other penicillins, cephalosporin antibiotics, other medicines, foods, dyes, or preservatives -pregnant or trying to get pregnant -breast-feeding How should I use this medicine? Take this medicine by mouth with a glass of water. Follow the directions on your prescription label. You may take this medicine with food or on an  empty stomach. Take your medicine at regular intervals. Do not take your medicine more often than directed. Take all of your medicine as directed even if you think your are better. Do not skip doses or stop your medicine early. Talk to your pediatrician regarding the use of this medicine in children. While this drug may be prescribed for selected conditions, precautions do apply. Overdosage: If you think you have taken too much of this medicine contact a poison control center or emergency room at once. NOTE: This medicine is only for you. Do not share this medicine with others. What if I miss a dose? If you miss a dose, take it as soon as you can. If it is almost time for your next dose, take only that dose. Do not take double or extra doses. What may interact with this medicine? -amiloride -birth control pills -chloramphenicol -macrolides -probenecid -sulfonamides -tetracyclines This list may not describe all possible interactions. Give your health care provider a list of all the medicines,  herbs, non-prescription drugs, or dietary supplements you use. Also tell them if you smoke, drink alcohol, or use illegal drugs. Some items may interact with your medicine. What should I watch for while using this medicine? Tell your doctor or health care professional if your symptoms do not improve in 2 or 3 days. Take all of the doses of your medicine as directed. Do not skip doses or stop your medicine early. If you are diabetic, you may get a false positive result for sugar in your urine with certain brands of urine tests. Check with your doctor. Do not treat diarrhea with over-the-counter products. Contact your doctor if you have diarrhea that lasts more than 2 days or if the diarrhea is severe and watery. What side effects may I notice from receiving this medicine? Side effects that you should report to your doctor or health care professional as soon as possible: -allergic reactions like skin rash,  itching or hives, swelling of the face, lips, or tongue -breathing problems -dark urine -redness, blistering, peeling or loosening of the skin, including inside the mouth -seizures -severe or watery diarrhea -trouble passing urine or change in the amount of urine -unusual bleeding or bruising -unusually weak or tired -yellowing of the eyes or skin Side effects that usually do not require medical attention (report to your doctor or health care professional if they continue or are bothersome): -dizziness -headache -stomach upset -trouble sleeping This list may not describe all possible side effects. Call your doctor for medical advice about side effects. You may report side effects to FDA at 1-800-FDA-1088. Where should I keep my medicine? Keep out of the reach of children. Store between 68 and 77 degrees F (20 and 25 degrees C). Keep bottle closed tightly. Throw away any unused medicine after the expiration date. NOTE: This sheet is a summary. It may not cover all possible information. If you have questions about this medicine, talk to your doctor, pharmacist, or health care provider.    2016, Elsevier/Gold Standard. (2007-12-20 14:10:59)

## 2015-12-09 ENCOUNTER — Emergency Department (HOSPITAL_COMMUNITY)
Admission: EM | Admit: 2015-12-09 | Discharge: 2015-12-09 | Disposition: A | Discharge status: Home / Self Care | Payer: Self-pay | Attending: Emergency Medicine | Admitting: Emergency Medicine

## 2015-12-09 ENCOUNTER — Encounter (HOSPITAL_COMMUNITY): Payer: Self-pay | Admitting: Emergency Medicine

## 2015-12-09 DIAGNOSIS — R0981 Nasal congestion: Secondary | ICD-10-CM | POA: Insufficient documentation

## 2015-12-09 DIAGNOSIS — I10 Essential (primary) hypertension: Secondary | ICD-10-CM | POA: Insufficient documentation

## 2015-12-09 DIAGNOSIS — R51 Headache: Secondary | ICD-10-CM | POA: Insufficient documentation

## 2015-12-09 DIAGNOSIS — Z79899 Other long term (current) drug therapy: Secondary | ICD-10-CM | POA: Insufficient documentation

## 2015-12-09 DIAGNOSIS — E119 Type 2 diabetes mellitus without complications: Secondary | ICD-10-CM | POA: Insufficient documentation

## 2015-12-09 DIAGNOSIS — R197 Diarrhea, unspecified: Secondary | ICD-10-CM | POA: Insufficient documentation

## 2015-12-09 DIAGNOSIS — R61 Generalized hyperhidrosis: Secondary | ICD-10-CM | POA: Insufficient documentation

## 2015-12-09 DIAGNOSIS — R531 Weakness: Secondary | ICD-10-CM | POA: Insufficient documentation

## 2015-12-09 DIAGNOSIS — Z7984 Long term (current) use of oral hypoglycemic drugs: Secondary | ICD-10-CM | POA: Insufficient documentation

## 2015-12-09 DIAGNOSIS — J3489 Other specified disorders of nose and nasal sinuses: Secondary | ICD-10-CM | POA: Insufficient documentation

## 2015-12-09 LAB — COMPREHENSIVE METABOLIC PANEL
ALBUMIN: 4 g/dL (ref 3.5–5.0)
ALT: 31 U/L (ref 17–63)
ANION GAP: 10 (ref 5–15)
AST: 33 U/L (ref 15–41)
Alkaline Phosphatase: 52 U/L (ref 38–126)
BILIRUBIN TOTAL: 0.5 mg/dL (ref 0.3–1.2)
BUN: 11 mg/dL (ref 6–20)
CALCIUM: 9.2 mg/dL (ref 8.9–10.3)
CO2: 23 mmol/L (ref 22–32)
Chloride: 102 mmol/L (ref 101–111)
Creatinine, Ser: 1.28 mg/dL — ABNORMAL HIGH (ref 0.61–1.24)
GFR calc non Af Amer: >60 mL/min (ref >60)
GLUCOSE: 178 mg/dL — AB (ref 65–99)
POTASSIUM: 3.9 mmol/L (ref 3.5–5.1)
SODIUM: 135 mmol/L (ref 135–145)
TOTAL PROTEIN: 7.6 g/dL (ref 6.5–8.1)

## 2015-12-09 LAB — I-STAT CG4 LACTIC ACID, ED: LACTIC ACID, VENOUS: 0.87 mmol/L (ref 0.5–2.0)

## 2015-12-09 LAB — CBC
HEMATOCRIT: 36.5 % — AB (ref 39.0–52.0)
HEMOGLOBIN: 12.9 g/dL — AB (ref 13.0–17.0)
MCH: 31 pg (ref 26.0–34.0)
MCHC: 35.3 g/dL (ref 30.0–36.0)
MCV: 87.7 fL (ref 78.0–100.0)
Platelets: 213 10*3/uL (ref 150–400)
RBC: 4.16 MIL/uL — AB (ref 4.22–5.81)
RDW: 13.7 % (ref 11.5–15.5)
WBC: 2.5 10*3/uL — ABNORMAL LOW (ref 4.0–10.5)

## 2015-12-09 LAB — TSH: TSH: 1.83 u[IU]/mL (ref 0.350–4.500)

## 2015-12-09 MED ORDER — CETIRIZINE HCL 10 MG PO CAPS: 10.0000 mg | ORAL_CAPSULE | Freq: Every day | ORAL | Status: DC

## 2015-12-09 MED ORDER — SODIUM CHLORIDE 0.9 % IV BOLUS (SEPSIS)
1000.0000 mL | Freq: Once | INTRAVENOUS | Status: AC
  Administered 2015-12-09: 1000 mL via INTRAVENOUS

## 2015-12-09 NOTE — ED Notes (Signed)
Pt sts URI sx and chills x 6 weeks; pt seen here x 2 for same; pt sts diarrhea yesterday

## 2015-12-09 NOTE — ED Provider Notes (Signed)
CSN: [id number]     Arrival date & time 12/09/15  1551 History   First MD Initiated Contact with Patient 12/09/15 2017     Chief Complaint  Patient presents with  . Chills  . Generalized Body Aches     (Consider location/radiation/quality/duration/timing/severity/associated sxs/prior Treatment) HPI  55 year old male presents with a chief complaint of continued cough, sinus congestion, and rhinorrhea. Has been ongoing and constant for 6 weeks. Also developed a mild, gradually worsening headache over the last 3 days. This is posterior. Patient states that he is also developed diarrhea since last night with 7 watery stools. No blood. Recently finished and a course of amoxicillin for sinus infection. Occasionally gets abdominal pain when coughing but at rest his abdomen does not hurt. Has been feeling weak. No fevers but has been having night sweats. Has lost 20 pounds since August 2016 with no obvious cause but he thinks may be being at his new work is causing this. No coughing up blood.  Past Medical History  Diagnosis Date  . Depression   . Hypertension   . Diabetes mellitus    Past Surgical History  Procedure Laterality Date  . Back surgery     History reviewed. No pertinent family history. Social History  Substance Use Topics  . Smoking status: Never Smoker   . Smokeless tobacco: None  . Alcohol Use: Yes     Comment: 2-3 cans beer/night    Review of Systems  Constitutional: Positive for chills and diaphoresis. Negative for fever.  HENT: Positive for congestion, rhinorrhea and sinus pressure.   Respiratory: Positive for cough. Negative for shortness of breath.   Cardiovascular: Negative for chest pain.  Gastrointestinal: Positive for diarrhea. Negative for vomiting, abdominal pain (with coughing only) and blood in stool.  Neurological: Positive for weakness and headaches. Negative for dizziness and numbness.  All other systems reviewed and are negative.     Allergies   Review of patient's allergies indicates no known allergies.  Home Medications   Prior to Admission medications   Medication Sig Start Date End Date Taking? Authorizing Provider  acetaminophen (TYLENOL) 500 MG tablet Take 1,000 mg by mouth every 6 (six) hours as needed for mild pain.   Yes Historical Provider, MD  guaifenesin (ROBITUSSIN) 100 MG/5ML syrup Take 200 mg by mouth 3 (three) times daily as needed for cough.   Yes Historical Provider, MD  lisinopril (PRINIVIL,ZESTRIL) 20 MG tablet Take 20 mg by mouth daily.   Yes Historical Provider, MD  metFORMIN (GLUCOPHAGE) 1000 MG tablet Take 1 tablet (1,000 mg total) by mouth 2 (two) times daily with a meal. 04/02/13  Yes [name], MD  amoxicillin (AMOXIL) 500 MG capsule Take 2 capsules (1,000 mg total) by mouth 2 (two) times daily. Patient not taking: Reported on 12/09/2015 11/14/15   Dione Booze, MD   BP 113/89 mmHg  Pulse 108  Temp(Src) 99.5 F (37.5 C) (Oral)  Resp 18  SpO2 100% Physical Exam  Constitutional: He is oriented to person, place, and time. He appears well-developed and well-nourished.  HENT:  Head: Normocephalic and atraumatic.  Right Ear: External ear normal.  Left Ear: External ear normal.  Nose: Nose normal.  Eyes: Right eye exhibits no discharge. Left eye exhibits no discharge.  Neck: Neck supple.  Cardiovascular: Normal rate, regular rhythm, normal heart sounds and intact distal pulses.   Pulmonary/Chest: Effort normal and breath sounds normal.  Abdominal: Soft. There is no tenderness.  Musculoskeletal: He exhibits no edema.  Neurological: He  is alert and oriented to person, place, and time.  Skin: Skin is warm and dry.  Nursing note and vitals reviewed.   ED Course  Procedures (including critical care time) Labs Review Labs Reviewed  COMPREHENSIVE METABOLIC PANEL - Abnormal; Notable for the following:    Glucose, Bld 178 (*)    Creatinine, Ser 1.28 (*)    All other components within normal limits   CBC - Abnormal; Notable for the following:    WBC 2.5 (*)    RBC 4.16 (*)    Hemoglobin 12.9 (*)    HCT 36.5 (*)    All other components within normal limits  C DIFFICILE QUICK SCREEN W PCR REFLEX  TSH  HIV ANTIBODY (ROUTINE TESTING)  I-STAT CG4 LACTIC ACID, ED    Imaging Review No results found. I have personally reviewed and evaluated these images and lab results as part of my medical decision-making.   EKG Interpretation None      MDM   Final diagnoses:  Diarrhea, unspecified type    Patient appears to have mild dehydration with a mildly elevated creatinine from baseline. Not high enough or severe enough to warrant admission, he feels better with IV fluids. Encouraged increased oral fluid intake. No signs of bacterial infection on exam. He has are to had a chest x-ray for this cough that has been present for quite some time, I do not feel repeat is necessary with no hypoxia, increased work of breathing, or abnormal lung sounds. Given his reports of weight loss and sweats I have recommended an HIV test which is currently pending. TSH is unremarkable. Vital signs have normalized with IV fluids. Was going to order before meals differential test given diarrhea with recent antibiotics but he has not had any bowel movements while being in the ER. Thus this is less likely. Plan to discharge home with decongestants given chronic congestion and patient is working on outpatient follow-up. Discussed return precautions.    Pricilla Loveless, MD 12/10/15 [phone]

## 2015-12-09 NOTE — ED Notes (Signed)
Pt ambulates independently and with steady gait at time of discharge. Discharge instructions and follow up information reviewed with patient. No other questions or concerns voiced at this time. RX x 1 given. 

## 2015-12-10 LAB — HIV ANTIBODY (ROUTINE TESTING W REFLEX): HIV SCREEN 4TH GENERATION: NONREACTIVE

## 2016-10-25 ENCOUNTER — Encounter (HOSPITAL_COMMUNITY): Payer: Self-pay | Admitting: Emergency Medicine

## 2016-10-25 ENCOUNTER — Emergency Department (HOSPITAL_COMMUNITY)
Admission: EM | Admit: 2016-10-25 | Discharge: 2016-10-25 | Disposition: A | Discharge status: Home / Self Care | Payer: Self-pay | Attending: Emergency Medicine | Admitting: Emergency Medicine

## 2016-10-25 DIAGNOSIS — E119 Type 2 diabetes mellitus without complications: Secondary | ICD-10-CM | POA: Insufficient documentation

## 2016-10-25 DIAGNOSIS — J40 Bronchitis, not specified as acute or chronic: Secondary | ICD-10-CM | POA: Insufficient documentation

## 2016-10-25 DIAGNOSIS — Z7984 Long term (current) use of oral hypoglycemic drugs: Secondary | ICD-10-CM | POA: Insufficient documentation

## 2016-10-25 DIAGNOSIS — Z79899 Other long term (current) drug therapy: Secondary | ICD-10-CM | POA: Insufficient documentation

## 2016-10-25 DIAGNOSIS — I10 Essential (primary) hypertension: Secondary | ICD-10-CM | POA: Insufficient documentation

## 2016-10-25 MED ORDER — HYDROCODONE-HOMATROPINE 5-1.5 MG/5ML PO SYRP: 5.0000 mL | ORAL_SOLUTION | Freq: Four times a day (QID) | ORAL | 0 refills | Status: DC | PRN

## 2016-10-25 MED ORDER — AZITHROMYCIN 250 MG PO TABS: 250.0000 mg | ORAL_TABLET | Freq: Every day | ORAL | 0 refills | Status: DC

## 2016-10-25 NOTE — ED Triage Notes (Signed)
Pt c/o cough and cold symptoms since 2 weeks ago. Pt reports 1 episode of nosebleed that occurred 1 week ago. No active bleeding noted at this time.

## 2016-10-25 NOTE — ED Notes (Signed)
Paper reviewed with patient and he verbalizes understanding

## 2016-10-25 NOTE — ED Provider Notes (Signed)
[facility]-EMERGENCY DEPT Provider Note   CSN: [id number] Arrival date & time: 10/25/16  1228  By signing my name below, I, Modena JanskyAlbert Thayil, attest that this documentation has been prepared under the direction and in the presence of Nelva Nayobert Jeyren Danowski, MD . Electronically Signed: Modena JanskyAlbert Thayil, Scribe. 10/25/2016. 4:22 PM.  History   Chief Complaint Chief Complaint  Patient presents with  . URI  . Epistaxis   The history is provided by the patient. No language interpreter was used.   HPI Comments: Nathan Duran is a 56 y.o. male who presents to the Emergency Department complaining of intermittent moderate cough that started about 2 weeks ago. He has been having gradaully worsening URI-like symptoms. He also had one episode of epistaxis a week ago. He describes the cough as worse at night and productive of clear/yellow sputum. He has associated diarrhea. He denies any hx of smoking, wheezing, fever, vomiting, or other complaints.   Past Medical History:  Diagnosis Date  . Depression   . Diabetes mellitus   . Hypertension     There are no active problems to display for this patient.   Past Surgical History:  Procedure Laterality Date  . BACK SURGERY         Home Medications    Prior to Admission medications   Medication Sig Start Date End Date Taking? Authorizing Provider  acetaminophen (TYLENOL) 500 MG tablet Take 1,000 mg by mouth every 6 (six) hours as needed for mild pain.    Historical Provider, MD  amoxicillin (AMOXIL) 500 MG capsule Take 2 capsules (1,000 mg total) by mouth 2 (two) times daily. Patient not taking: Reported on 12/09/2015 11/14/15   Dione Boozeavid Glick, MD  azithromycin (ZITHROMAX) 250 MG tablet Take 1 tablet (250 mg total) by mouth daily. Take first 2 tablets together, then 1 every day until finished. 10/25/16   [name], MD  Cetirizine HCl 10 MG CAPS Take 1 capsule (10 mg total) by mouth daily. 12/09/15   Pricilla LovelessScott Goldston, MD  guaifenesin (ROBITUSSIN) 100 MG/5ML syrup  Take 200 mg by mouth 3 (three) times daily as needed for cough.    Historical Provider, MD  HYDROcodone-homatropine (HYCODAN) 5-1.5 MG/5ML syrup Take 5 mLs by mouth every 6 (six) hours as needed for cough. 10/25/16   Nelva Nayobert Dashanna Kinnamon, MD  lisinopril (PRINIVIL,ZESTRIL) 20 MG tablet Take 20 mg by mouth daily.    Historical Provider, MD  metFORMIN (GLUCOPHAGE) 1000 MG tablet Take 1 tablet (1,000 mg total) by mouth 2 (two) times daily with a meal. 04/02/13   Benjiman CoreNathan Pickering, MD    Family History No family history on file.  Social History Social History  Substance Use Topics  . Smoking status: Never Smoker  . Smokeless tobacco: Never Used  . Alcohol use Yes     Comment: 2-3 cans beer/night     Allergies   Patient has no known allergies.   Review of Systems Review of Systems  Constitutional: Negative for fever.  HENT: Positive for nosebleeds.   Respiratory: Positive for cough. Negative for wheezing.   Gastrointestinal: Positive for diarrhea. Negative for vomiting.  All other systems reviewed and are negative.    Physical Exam Updated Vital Signs BP 146/90 (BP Location: Left Arm)   Pulse 87   Temp 99 F (37.2 C) (Oral)   Resp 18   Ht 5\' 8"  (1.727 m)   Wt 173 lb (78.5 kg)   SpO2 98%   BMI 26.30 kg/m   Physical Exam  Constitutional: He is  oriented to person, place, and time. He appears well-developed and well-nourished. No distress.  HENT:  Head: Normocephalic and atraumatic.  Eyes: Pupils are equal, round, and reactive to light.  Neck: Normal range of motion.  Cardiovascular: Normal rate and intact distal pulses.   Pulmonary/Chest: Effort normal and breath sounds normal. He has no wheezes.  Abdominal: Normal appearance. He exhibits no distension.  Musculoskeletal: Normal range of motion.  Neurological: He is alert and oriented to person, place, and time. No cranial nerve deficit.  Skin: Skin is warm and dry. No rash noted.  Psychiatric: He has a normal mood and affect.  His behavior is normal.  Nursing note and vitals reviewed.    ED Treatments / Results  DIAGNOSTIC STUDIES: Oxygen Saturation is 98% on RA, normal by my interpretation.    COORDINATION OF CARE: 4:26 PM- Pt advised of plan for treatment and pt agrees.  Labs (all labs ordered are listed, but only abnormal results are displayed) Labs Reviewed - No data to display  EKG  EKG Interpretation None       Radiology No results found.  Procedures Procedures (including critical care time)  Medications Ordered in ED Medications - No data to display   Initial Impression / Assessment and Plan / ED Course  I have reviewed the triage vital signs and the nursing notes.  Pertinent labs & imaging results that were available during my care of the patient were reviewed by me and considered in my medical decision making (see chart for details).  Clinical Course       Final Clinical Impressions(s) / ED Diagnoses   Final diagnoses:  Bronchitis    New Prescriptions New Prescriptions   AZITHROMYCIN (ZITHROMAX) 250 MG TABLET    Take 1 tablet (250 mg total) by mouth daily. Take first 2 tablets together, then 1 every day until finished.   HYDROCODONE-HOMATROPINE (HYCODAN) 5-1.5 MG/5ML SYRUP    Take 5 mLs by mouth every 6 (six) hours as needed for cough.  I personally performed the services described in this documentation, which was scribed in my presence. The recorded information has been reviewed and considered.    Nelva Nay, MD 10/25/16 [phone]

## 2018-07-08 ENCOUNTER — Ambulatory Visit (HOSPITAL_COMMUNITY)
Admission: EM | Admit: 2018-07-08 | Discharge: 2018-07-08 | Disposition: A | Discharge status: Home / Self Care | Payer: BLUE CROSS/BLUE SHIELD | Attending: Family Medicine | Admitting: Family Medicine

## 2018-07-08 ENCOUNTER — Encounter (HOSPITAL_COMMUNITY): Payer: Self-pay

## 2018-07-08 ENCOUNTER — Other Ambulatory Visit: Payer: Self-pay

## 2018-07-08 DIAGNOSIS — R0789 Other chest pain: Secondary | ICD-10-CM | POA: Diagnosis not present

## 2018-07-08 DIAGNOSIS — E1165 Type 2 diabetes mellitus with hyperglycemia: Secondary | ICD-10-CM

## 2018-07-08 DIAGNOSIS — I10 Essential (primary) hypertension: Secondary | ICD-10-CM

## 2018-07-08 LAB — POCT I-STAT, CHEM 8
BUN: 9 mg/dL (ref 6–20)
CALCIUM ION: 1.25 mmol/L (ref 1.15–1.40)
Chloride: 101 mmol/L (ref 98–111)
Creatinine, Ser: 0.9 mg/dL (ref 0.61–1.24)
GLUCOSE: 195 mg/dL — AB (ref 70–99)
HCT: 39 % (ref 39.0–52.0)
HEMOGLOBIN: 13.3 g/dL (ref 13.0–17.0)
Potassium: 3.7 mmol/L (ref 3.5–5.1)
Sodium: 139 mmol/L (ref 135–145)
TCO2: 29 mmol/L (ref 22–32)

## 2018-07-08 MED ORDER — LISINOPRIL 20 MG PO TABS: 20.0000 mg | ORAL_TABLET | Freq: Every day | ORAL | 1 refills | Status: DC

## 2018-07-08 NOTE — ED Provider Notes (Addendum)
[facility]   [id number] 07/08/18 Arrival Time: 1448  CC: Blood pressure medication  SUBJECTIVE:  Nathan Duran is a 57 y.o. male hx of DM, HTN, who presents for blood pressure medication refill.  Hx of HTN x 10 years.  States blood pressure on average is "pretty good."  Takes lisinopril 20mg  once daily.  Ran out medication about a month ago.  Does not have a PCP.  States he has a "pulling sensation" in the left side of chest.  Intermittent lasting 3 seconds at a time, and occurs about three times a day.  Symptoms began 2 days ago.  States symptoms are stable.  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  Does also admit to recent stressors including recent deaths in the family, friend with cancer, and stress associated with working.  Denies similar symptoms in the past.  Family hx significant for MI in father at young age.  Does admit to alcohol use.    Denies HA, vision changes, dizziness, lightheadedness, chest pain, shortness of breath, abdominal pain, changes in bowel or bladder habits.    ROS: As per HPI.  Past Medical History:  Diagnosis Date  . Depression   . Diabetes mellitus   . Hypertension    Past Surgical History:  Procedure Laterality Date  . BACK SURGERY     No Known Allergies No current facility-administered medications on file prior to encounter.    Current Outpatient Medications on File Prior to Encounter  Medication Sig Dispense Refill  . acetaminophen (TYLENOL) 500 MG tablet Take 1,000 mg by mouth every 6 (six) hours as needed for mild pain.    Marland Kitchen amoxicillin (AMOXIL) 500 MG capsule Take 2 capsules (1,000 mg total) by mouth 2 (two) times daily. (Patient not taking: Reported on 12/09/2015) 40 capsule 0  . azithromycin (ZITHROMAX) 250 MG tablet Take 1 tablet (250 mg total) by mouth daily. Take first 2 tablets together, then 1 every day until finished. 6 tablet 0  . Cetirizine HCl 10 MG CAPS Take 1 capsule (10 mg  total) by mouth daily. 30 capsule 0  . guaifenesin (ROBITUSSIN) 100 MG/5ML syrup Take 200 mg by mouth 3 (three) times daily as needed for cough.    Marland Kitchen HYDROcodone-homatropine (HYCODAN) 5-1.5 MG/5ML syrup Take 5 mLs by mouth every 6 (six) hours as needed for cough. 120 mL 0  . lisinopril (PRINIVIL,ZESTRIL) 20 MG tablet Take 20 mg by mouth daily.    . metFORMIN (GLUCOPHAGE) 1000 MG tablet Take 1 tablet (1,000 mg total) by mouth 2 (two) times daily with a meal. 60 tablet 0    Social History   Socioeconomic History  . Marital status: Single    Spouse name: Not on file  . Number of children: Not on file  . Years of education: Not on file  . Highest education level: Not on file  Occupational History  . Not on file  Social Needs  . Financial resource strain: Not on file  . Food insecurity:    Worry: Not on file    Inability: Not on file  . Transportation needs:    Medical: Not on file    Non-medical: Not on file  Tobacco Use  . Smoking status: Never Smoker  . Smokeless tobacco: Never Used  Substance and Sexual Activity  . Alcohol use: Yes    Comment: 2-3 cans beer/night  . Drug use: No  . Sexual activity: Not on file  Lifestyle  .  Physical activity:    Days per week: Not on file    Minutes per session: Not on file  . Stress: Not on file  Relationships  . Social connections:    Talks on phone: Not on file    Gets together: Not on file    Attends religious service: Not on file    Active member of club or organization: Not on file    Attends meetings of clubs or organizations: Not on file    Relationship status: Not on file  . Intimate partner violence:    Fear of current or ex partner: Not on file    Emotionally abused: Not on file    Physically abused: Not on file    Forced sexual activity: Not on file  Other Topics Concern  . Not on file  Social History Narrative  . Not on file   History reviewed. No pertinent family history.   OBJECTIVE:  Vitals:   07/08/18 1507  07/08/18 1508  BP:  (!) 194/93  Pulse:  90  Resp:  18  Temp:  98.2 F (36.8 C)  SpO2:  99%  Weight: 166 lb (75.3 kg)      General appearance: AOx3 in no acute distress HEENT: Ears: EACs clear, Eyes: EOM grossly intact.  Nose: no obvious rhinorrhea Neck: supple without LAD Lungs: clear to auscultation bilaterally without adventitious breath sounds Heart: No heaves, lifts or thrills; regular rate and rhythm without murmur, gallops or rubs;  Radial pulses 2+ symmetrical bilaterally Abdomen: soft, nondistended, normal active bowel sounds; nontender to palpation; no guarding Skin: warm and dry Psychological: alert and cooperative; normal mood and affect  LABS:  Results for orders placed or performed during the hospital encounter of 07/08/18 (from the past 24 hour(s))  I-STAT, chem 8     Status: Abnormal   Collection Time: 07/08/18  3:50 PM  Result Value Ref Range   Sodium 139 135 - 145 mmol/L   Potassium 3.7 3.5 - 5.1 mmol/L   Chloride 101 98 - 111 mmol/L   BUN 9 6 - 20 mg/dL   Creatinine, Ser 1.61 0.61 - 1.24 mg/dL   Glucose, Bld 096 (H) 70 - 99 mg/dL   Calcium, Ion 0.45 4.09 - 1.40 mmol/L   TCO2 29 22 - 32 mmol/L   Hemoglobin 13.3 13.0 - 17.0 g/dL   HCT 81.1 91.4 - 78.2 %    EKG:  EKG normal sinus rhythm without ST elevations, depressions, or prolonged PR interval.  No narrowing or widening of the QRS complexes.  Reviewed EKG with Dr. Dayton Scrape.  Comparable with previous EKG   ASSESSMENT & PLAN:  1. Other chest pain   2. Benign essential HTN   3. Type 2 diabetes mellitus with hyperglycemia, unspecified whether long term insulin use [facility])    Discussed patient case with Dr. Dayton Scrape.  No immediate threat to life at this time.  Will have patient follow up with PCP or cardiology ASAP.  Given strict return and ER precautions.    No orders of the defined types were placed in this encounter.  EKG and blood work did not show concerning findings Lisinopril refilled Recommend  close follow up with PCP or cardiologist within the next week if possible.   Return or go to the ED if you have any new or worsening symptoms such as chest pain, shortness of breath, nausea, vomiting, excessive sweating, numbness in left arm, abdominal pain, etc...  Reviewed expectations re: course of current medical issues. Questions  answered. Outlined signs and symptoms indicating need for more acute intervention. Patient verbalized understanding. After Visit Summary given.    Rennis Harding, PA-C 07/08/18 1655

## 2018-07-08 NOTE — Discharge Instructions (Addendum)
EKG and blood work did not show concerning findings Lisinopril refilled Recommend close follow up with PCP or cardiologist within the next week if possible.   Return or go to the ED if you have any new or worsening symptoms such as chest pain, shortness of breath, nausea, vomiting, excessive sweating, numbness in left arm, abdominal pain, etc..Marland Kitchen

## 2018-07-08 NOTE — ED Triage Notes (Signed)
Pt states his b/p is up and he has been out his b/p med for about a month.

## 2018-09-04 ENCOUNTER — Other Ambulatory Visit: Payer: Self-pay

## 2018-09-04 ENCOUNTER — Emergency Department (HOSPITAL_COMMUNITY)
Admission: EM | Admit: 2018-09-04 | Discharge: 2018-09-04 | Disposition: A | Discharge status: Home / Self Care | Payer: BLUE CROSS/BLUE SHIELD | Attending: Emergency Medicine | Admitting: Emergency Medicine

## 2018-09-04 ENCOUNTER — Encounter (HOSPITAL_COMMUNITY): Payer: Self-pay | Admitting: *Deleted

## 2018-09-04 ENCOUNTER — Emergency Department (HOSPITAL_COMMUNITY): Payer: BLUE CROSS/BLUE SHIELD

## 2018-09-04 DIAGNOSIS — R0981 Nasal congestion: Secondary | ICD-10-CM | POA: Diagnosis not present

## 2018-09-04 DIAGNOSIS — H6691 Otitis media, unspecified, right ear: Secondary | ICD-10-CM | POA: Insufficient documentation

## 2018-09-04 DIAGNOSIS — R05 Cough: Secondary | ICD-10-CM | POA: Diagnosis not present

## 2018-09-04 DIAGNOSIS — R093 Abnormal sputum: Secondary | ICD-10-CM | POA: Diagnosis not present

## 2018-09-04 DIAGNOSIS — Z7984 Long term (current) use of oral hypoglycemic drugs: Secondary | ICD-10-CM | POA: Insufficient documentation

## 2018-09-04 DIAGNOSIS — M542 Cervicalgia: Secondary | ICD-10-CM | POA: Insufficient documentation

## 2018-09-04 DIAGNOSIS — R0602 Shortness of breath: Secondary | ICD-10-CM | POA: Insufficient documentation

## 2018-09-04 DIAGNOSIS — E119 Type 2 diabetes mellitus without complications: Secondary | ICD-10-CM | POA: Insufficient documentation

## 2018-09-04 DIAGNOSIS — I10 Essential (primary) hypertension: Secondary | ICD-10-CM | POA: Insufficient documentation

## 2018-09-04 DIAGNOSIS — H6690 Otitis media, unspecified, unspecified ear: Secondary | ICD-10-CM

## 2018-09-04 DIAGNOSIS — H9201 Otalgia, right ear: Secondary | ICD-10-CM | POA: Diagnosis present

## 2018-09-04 DIAGNOSIS — Z79899 Other long term (current) drug therapy: Secondary | ICD-10-CM | POA: Insufficient documentation

## 2018-09-04 LAB — CBC WITH DIFFERENTIAL/PLATELET
Abs Immature Granulocytes: 0.03 10*3/uL (ref 0.00–0.07)
Basophils Absolute: 0 10*3/uL (ref 0.0–0.1)
Basophils Relative: 0 %
Eosinophils Absolute: 0.1 10*3/uL (ref 0.0–0.5)
Eosinophils Relative: 1 %
HCT: 37 % — ABNORMAL LOW (ref 39.0–52.0)
Hemoglobin: 12.6 g/dL — ABNORMAL LOW (ref 13.0–17.0)
Immature Granulocytes: 0 %
Lymphocytes Relative: 11 %
Lymphs Abs: 1 10*3/uL (ref 0.7–4.0)
MCH: 30.5 pg (ref 26.0–34.0)
MCHC: 34.1 g/dL (ref 30.0–36.0)
MCV: 89.6 fL (ref 80.0–100.0)
Monocytes Absolute: 1.2 10*3/uL — ABNORMAL HIGH (ref 0.1–1.0)
Monocytes Relative: 14 %
Neutro Abs: 6.4 10*3/uL (ref 1.7–7.7)
Neutrophils Relative %: 74 %
Platelets: 257 10*3/uL (ref 150–400)
RBC: 4.13 MIL/uL — ABNORMAL LOW (ref 4.22–5.81)
RDW: 12.1 % (ref 11.5–15.5)
WBC: 8.8 10*3/uL (ref 4.0–10.5)
nRBC: 0 % (ref 0.0–0.2)

## 2018-09-04 LAB — BASIC METABOLIC PANEL
Anion gap: 10 (ref 5–15)
BUN: 12 mg/dL (ref 6–20)
CO2: 25 mmol/L (ref 22–32)
Calcium: 9 mg/dL (ref 8.9–10.3)
Chloride: 100 mmol/L (ref 98–111)
Creatinine, Ser: 1 mg/dL (ref 0.61–1.24)
GFR calc Af Amer: >60 mL/min (ref >60)
GFR calc non Af Amer: >60 mL/min (ref >60)
Glucose, Bld: 222 mg/dL — ABNORMAL HIGH (ref 70–99)
Potassium: 3.9 mmol/L (ref 3.5–5.1)
Sodium: 135 mmol/L (ref 135–145)

## 2018-09-04 MED ORDER — KETOROLAC TROMETHAMINE 15 MG/ML IJ SOLN
15.0000 mg | Freq: Once | INTRAMUSCULAR | Status: AC
  Administered 2018-09-04: 15 mg via INTRAVENOUS
  Filled 2018-09-04: qty 1

## 2018-09-04 MED ORDER — IOPAMIDOL (ISOVUE-370) INJECTION 76%
75.0000 mL | Freq: Once | INTRAVENOUS | Status: AC | PRN
  Administered 2018-09-04: 19:00:00 via INTRAVENOUS

## 2018-09-04 MED ORDER — TRAMADOL HCL 50 MG PO TABS: 50.0000 mg | ORAL_TABLET | Freq: Four times a day (QID) | ORAL | 0 refills | Status: DC | PRN

## 2018-09-04 MED ORDER — AMOXICILLIN 500 MG PO CAPS
1000.0000 mg | ORAL_CAPSULE | Freq: Once | ORAL | Status: AC
  Administered 2018-09-04: 1000 mg via ORAL
  Filled 2018-09-04: qty 2

## 2018-09-04 MED ORDER — AMOXICILLIN 500 MG PO CAPS: 1000.0000 mg | ORAL_CAPSULE | Freq: Two times a day (BID) | ORAL | 0 refills | Status: DC

## 2018-09-04 MED ORDER — MORPHINE SULFATE (PF) 4 MG/ML IV SOLN
4.0000 mg | Freq: Once | INTRAVENOUS | Status: AC
  Administered 2018-09-04: 4 mg via INTRAVENOUS
  Filled 2018-09-04: qty 1

## 2018-09-04 MED ORDER — IOPAMIDOL (ISOVUE-370) INJECTION 76%
INTRAVENOUS | Status: AC
  Filled 2018-09-04: qty 50

## 2018-09-04 MED ORDER — SODIUM CHLORIDE 0.9 % IV SOLN
INTRAVENOUS | Status: DC
  Administered 2018-09-04: 18:00:00 via INTRAVENOUS

## 2018-09-04 NOTE — ED Triage Notes (Signed)
PT reports RT ear pain and congestion. Pt reports productive cough with yellow - brown  Mucous.

## 2018-09-15 NOTE — ED Provider Notes (Signed)
[facility] EMERGENCY DEPARTMENT Provider Note   CSN: [id number] Arrival date & time: 09/04/18  1651     History   Chief Complaint Chief Complaint  Patient presents with  . Otalgia    HPI VERDON FERRANTE is a 57 y.o. male.  HPI   57 year old male with right neck and right ear pain.  Onset to 3 days ago.  Persistent since then.  No drainage.  Also has had a cough productive for yellow sputum.  Mild shortness of breath.  Subjective fevers.  No nausea vomiting.  No diarrhea.  Past Medical History:  Diagnosis Date  . Depression   . Diabetes mellitus   . Hypertension     There are no active problems to display for this patient.   Past Surgical History:  Procedure Laterality Date  . BACK SURGERY          Home Medications    Prior to Admission medications   Medication Sig Start Date End Date Taking? Authorizing Provider  acetaminophen (TYLENOL) 500 MG tablet Take 1,000 mg by mouth every 6 (six) hours as needed for mild pain.    [provider]  amoxicillin (AMOXIL) 500 MG capsule Take 2 capsules (1,000 mg total) by mouth 2 (two) times daily. 09/04/18   Raeford Razor, MD  azithromycin (ZITHROMAX) 250 MG tablet Take 1 tablet (250 mg total) by mouth daily. Take first 2 tablets together, then 1 every day until finished. 10/25/16   Nelva Nay, MD  Cetirizine HCl 10 MG CAPS Take 1 capsule (10 mg total) by mouth daily. 12/09/15   Pricilla Loveless, MD  guaifenesin (ROBITUSSIN) 100 MG/5ML syrup Take 200 mg by mouth 3 (three) times daily as needed for cough.    [provider]  HYDROcodone-homatropine (HYCODAN) 5-1.5 MG/5ML syrup Take 5 mLs by mouth every 6 (six) hours as needed for cough. 10/25/16   Nelva Nay, MD  lisinopril (PRINIVIL,ZESTRIL) 20 MG tablet Take 1 tablet (20 mg total) by mouth daily. 07/08/18   Wurst, [place], PA-C  metFORMIN (GLUCOPHAGE) 1000 MG tablet Take 1 tablet (1,000 mg total) by mouth 2 (two) times daily with a  meal. 04/02/13   [name], MD  traMADol (ULTRAM) 50 MG tablet Take 1 tablet (50 mg total) by mouth every 6 (six) hours as needed for moderate pain or severe pain. 09/04/18   Raeford Razor, MD    Family History History reviewed. No pertinent family history.  Social History Social History   Tobacco Use  . Smoking status: Never Smoker  . Smokeless tobacco: Never Used  Substance Use Topics  . Alcohol use: Yes    Comment: 2-3 cans beer/night  . Drug use: No     Allergies   Patient has no known allergies.   Review of Systems Review of Systems  All systems reviewed and negative, other than as noted in HPI.  Physical Exam Updated Vital Signs BP (!) 132/96 (BP Location: Right Arm)   Pulse 93   Temp 99.3 F (37.4 C) (Oral)   Resp 16   Ht 5' 8.5" (1.74 m)   Wt 74.8 kg   SpO2 99%   BMI 24.72 kg/m   Physical Exam  Constitutional: He appears well-developed and well-nourished. No distress.  HENT:  Head: Normocephalic and atraumatic.  Is erythematous, effusion.  Perhaps mildly bulging.  No perforation.  External auditory canal is normal in appearance.  Neck is tender to palpation along the right lateral to anterior aspect.  Movements appreciated.  Posterior pharynx is clear.  Eyes: Conjunctivae are normal. Right eye exhibits no discharge. Left eye exhibits no discharge.  Neck: Neck supple.  Cardiovascular: Normal rate, regular rhythm and normal heart sounds. Exam reveals no gallop and no friction rub.  No murmur heard. Pulmonary/Chest: Effort normal and breath sounds normal. No respiratory distress.  Abdominal: Soft. He exhibits no distension. There is no tenderness.  Musculoskeletal: He exhibits no edema or tenderness.  Neurological: He is alert.  Skin: Skin is warm and dry.  Psychiatric: He has a normal mood and affect. His behavior is normal. Thought content normal.  Nursing note and vitals reviewed.    ED Treatments / Results  Labs (all labs ordered are  listed, but only abnormal results are displayed) Labs Reviewed  CBC WITH DIFFERENTIAL/PLATELET - Abnormal; Notable for the following components:      Result Value   RBC 4.13 (*)    Hemoglobin 12.6 (*)    HCT 37.0 (*)    Monocytes Absolute 1.2 (*)    All other components within normal limits  BASIC METABOLIC PANEL - Abnormal; Notable for the following components:   Glucose, Bld 222 (*)    All other components within normal limits    EKG None  Radiology No results found.  Ct Angio Head W Or Wo Contrast  Result Date: 09/04/2018 CLINICAL DATA:  RIGHT ear pain and congestion. History of hypertension and diabetes. EXAM: CT ANGIOGRAPHY HEAD AND NECK TECHNIQUE: Multidetector CT imaging of the head and neck was performed using the standard protocol during bolus administration of intravenous contrast. Multiplanar CT image reconstructions and MIPs were obtained to evaluate the vascular anatomy. Carotid stenosis measurements (when applicable) are obtained utilizing NASCET criteria, using the distal internal carotid diameter as the denominator. CONTRAST:  75 cc ISOVUE-370 IOPAMIDOL (ISOVUE-370) INJECTION 76% COMPARISON:  None. FINDINGS: CT HEAD FINDINGS BRAIN: No intraparenchymal hemorrhage, mass effect nor midline shift. The ventricles and sulci are normal. No acute large vascular territory infarcts. No abnormal extra-axial fluid collections. Basal cisterns are patent. VASCULAR: Unremarkable. SKULL/SOFT TISSUES: No skull fracture. No significant soft tissue swelling. ORBITS/SINUSES: The included ocular globes and orbital contents are normal.Paranasal sinusitis, severe within RIGHT maxillary sinus. RIGHT middle ear and mastoid effusion. OTHER: None. CTA NECK FINDINGS: AORTIC ARCH: Normal appearance of the thoracic arch, 2 vessel arch is a normal variant. The origins of the innominate, left Common carotid artery and subclavian artery are widely patent. RIGHT CAROTID SYSTEM: Common carotid artery is patent.  Normal appearance of the carotid bifurcation without hemodynamically significant stenosis by NASCET criteria. Normal appearance of the internal carotid artery. LEFT CAROTID SYSTEM: Common carotid artery is patent. Normal appearance of the carotid bifurcation without hemodynamically significant stenosis by NASCET criteria. Normal appearance of the internal carotid artery. VERTEBRAL ARTERIES:Codominant patent vertebral arteries with mild extrinsic compression due to degenerative cervical spine. SKELETON: No acute osseous process though bone windows have not been submitted. Degenerative changes cervical spine resulting in mild canal stenosis C5-6. Multilevel neural foraminal narrowing. OTHER NECK: Nasopharyngeal edema and mucosal enhancement. UPPER CHEST: Included lung apices are clear. Mild centrilobular emphysema. No superior mediastinal lymphadenopathy. CTA HEAD FINDINGS: ANTERIOR CIRCULATION: Patent cervical internal carotid arteries, petrous, cavernous and supra clinoid internal carotid arteries. Patent anterior communicating artery. Patent anterior and middle cerebral arteries. No large vessel occlusion, significant stenosis, contrast extravasation or aneurysm. POSTERIOR CIRCULATION: Patent vertebral arteries, vertebrobasilar junction and basilar artery, as well as main branch vessels. Patent posterior cerebral arteries, moderate luminal irregularity. RIGHT posterior communicating artery  present. No large vessel occlusion, significant stenosis, contrast extravasation or aneurysm. VENOUS SINUSES: Major dural venous sinuses are patent though not tailored for evaluation on this angiographic examination. ANATOMIC VARIANTS: None. DELAYED PHASE: No abnormal intracranial enhancement. MIP images reviewed. IMPRESSION: CT HEAD: 1. Normal CT with and without contrast. 2. Paranasal sinusitis.  RIGHT middle ear and mastoid effusion. CTA NECK: 1. No hemodynamically significant stenosis ICA's. Patent vertebral arteries. 2. Mild  pharyngitis. CTA HEAD: 1. No emergent large vessel occlusion or flow-limiting stenosis. 2. Moderate posterior cerebral artery atherosclerosis. Electronically Signed   By: Awilda Metro M.D.   On: 09/04/2018 19:33   Ct Angio Neck W And/or Wo Contrast  Result Date: 09/04/2018 CLINICAL DATA:  RIGHT ear pain and congestion. History of hypertension and diabetes. EXAM: CT ANGIOGRAPHY HEAD AND NECK TECHNIQUE: Multidetector CT imaging of the head and neck was performed using the standard protocol during bolus administration of intravenous contrast. Multiplanar CT image reconstructions and MIPs were obtained to evaluate the vascular anatomy. Carotid stenosis measurements (when applicable) are obtained utilizing NASCET criteria, using the distal internal carotid diameter as the denominator. CONTRAST:  75 cc ISOVUE-370 IOPAMIDOL (ISOVUE-370) INJECTION 76% COMPARISON:  None. FINDINGS: CT HEAD FINDINGS BRAIN: No intraparenchymal hemorrhage, mass effect nor midline shift. The ventricles and sulci are normal. No acute large vascular territory infarcts. No abnormal extra-axial fluid collections. Basal cisterns are patent. VASCULAR: Unremarkable. SKULL/SOFT TISSUES: No skull fracture. No significant soft tissue swelling. ORBITS/SINUSES: The included ocular globes and orbital contents are normal.Paranasal sinusitis, severe within RIGHT maxillary sinus. RIGHT middle ear and mastoid effusion. OTHER: None. CTA NECK FINDINGS: AORTIC ARCH: Normal appearance of the thoracic arch, 2 vessel arch is a normal variant. The origins of the innominate, left Common carotid artery and subclavian artery are widely patent. RIGHT CAROTID SYSTEM: Common carotid artery is patent. Normal appearance of the carotid bifurcation without hemodynamically significant stenosis by NASCET criteria. Normal appearance of the internal carotid artery. LEFT CAROTID SYSTEM: Common carotid artery is patent. Normal appearance of the carotid bifurcation without  hemodynamically significant stenosis by NASCET criteria. Normal appearance of the internal carotid artery. VERTEBRAL ARTERIES:Codominant patent vertebral arteries with mild extrinsic compression due to degenerative cervical spine. SKELETON: No acute osseous process though bone windows have not been submitted. Degenerative changes cervical spine resulting in mild canal stenosis C5-6. Multilevel neural foraminal narrowing. OTHER NECK: Nasopharyngeal edema and mucosal enhancement. UPPER CHEST: Included lung apices are clear. Mild centrilobular emphysema. No superior mediastinal lymphadenopathy. CTA HEAD FINDINGS: ANTERIOR CIRCULATION: Patent cervical internal carotid arteries, petrous, cavernous and supra clinoid internal carotid arteries. Patent anterior communicating artery. Patent anterior and middle cerebral arteries. No large vessel occlusion, significant stenosis, contrast extravasation or aneurysm. POSTERIOR CIRCULATION: Patent vertebral arteries, vertebrobasilar junction and basilar artery, as well as main branch vessels. Patent posterior cerebral arteries, moderate luminal irregularity. RIGHT posterior communicating artery present. No large vessel occlusion, significant stenosis, contrast extravasation or aneurysm. VENOUS SINUSES: Major dural venous sinuses are patent though not tailored for evaluation on this angiographic examination. ANATOMIC VARIANTS: None. DELAYED PHASE: No abnormal intracranial enhancement. MIP images reviewed. IMPRESSION: CT HEAD: 1. Normal CT with and without contrast. 2. Paranasal sinusitis.  RIGHT middle ear and mastoid effusion. CTA NECK: 1. No hemodynamically significant stenosis ICA's. Patent vertebral arteries. 2. Mild pharyngitis. CTA HEAD: 1. No emergent large vessel occlusion or flow-limiting stenosis. 2. Moderate posterior cerebral artery atherosclerosis. Electronically Signed   By: Awilda Metro M.D.   On: 09/04/2018 19:33    Procedures Procedures (including  critical  care time)  Medications Ordered in ED Medications  ketorolac (TORADOL) 15 MG/ML injection 15 mg (15 mg Intravenous Given 09/04/18 1739)  morphine 4 MG/ML injection 4 mg (4 mg Intravenous Given 09/04/18 1739)  iopamidol (ISOVUE-370) 76 % injection 75 mL ( Intravenous Contrast Given 09/04/18 1852)  amoxicillin (AMOXIL) capsule 1,000 mg (1,000 mg Oral Given 09/04/18 1958)     Initial Impression / Assessment and Plan / ED Course  I have reviewed the triage vital signs and the nursing notes.  Pertinent labs & imaging results that were available during my care of the patient were reviewed by me and considered in my medical decision making (see chart for details).     57 year old male with neck pain and ear pain.  Probably from otitis media.  Reassuring work-up otherwise.  Course of amoxicillin.  As needed pain medication.  Return process discussed.  Final Clinical Impressions(s) / ED Diagnoses   Final diagnoses:  Neck pain  Acute otitis media, unspecified otitis media type    ED Discharge Orders         Ordered    amoxicillin (AMOXIL) 500 MG capsule  2 times daily     09/04/18 1938    traMADol (ULTRAM) 50 MG tablet  Every 6 hours PRN     09/04/18 1938           Raeford RazorKohut, [place], MD 09/15/18 1240

## 2018-09-23 ENCOUNTER — Ambulatory Visit (HOSPITAL_COMMUNITY)
Admission: EM | Admit: 2018-09-23 | Discharge: 2018-09-23 | Disposition: A | Discharge status: Home / Self Care | Payer: BLUE CROSS/BLUE SHIELD | Attending: Family Medicine | Admitting: Family Medicine

## 2018-09-23 ENCOUNTER — Encounter (HOSPITAL_COMMUNITY): Payer: Self-pay

## 2018-09-23 ENCOUNTER — Other Ambulatory Visit: Payer: Self-pay

## 2018-09-23 DIAGNOSIS — H6981 Other specified disorders of Eustachian tube, right ear: Secondary | ICD-10-CM

## 2018-09-23 DIAGNOSIS — H9201 Otalgia, right ear: Secondary | ICD-10-CM | POA: Diagnosis not present

## 2018-09-23 MED ORDER — FLUTICASONE PROPIONATE 50 MCG/ACT NA SUSP: 1.0000 | Freq: Every day | NASAL | 0 refills | Status: DC

## 2018-09-23 MED ORDER — CETIRIZINE HCL 10 MG PO CAPS: 10.0000 mg | ORAL_CAPSULE | Freq: Every day | ORAL | 0 refills | Status: DC

## 2018-09-23 NOTE — ED Triage Notes (Signed)
Pt cc he has right ear discomfort x 2 weeks or more. Pt has been seen at the ER for this on 09/04/18.

## 2018-09-23 NOTE — Discharge Instructions (Signed)
Discomfort in ear likely from poor drainage and congestion  Please use Flonase nasal spray 1 to 2 sprays in each nostril daily May also try using daily Zyrtec to further help with any congestion  Tylenol for pain  Follow-up here in emergency room if developing neck stiffness, swelling, worsening pain, fevers  Follow-up with ENT if symptoms persisting

## 2018-09-23 NOTE — ED Provider Notes (Signed)
[facility]    CSN: [id number] Arrival date & time: 09/23/18  1212     History   Chief Complaint Chief Complaint  Patient presents with  . Otalgia    HPI Nathan Duran is a 57 y.o. male history of hypertension, DM type II, presenting today for evaluation of right ear pain.  Patient states that he has had this ear pain for the past 2 weeks.  He was seen in the emergency room on 11/24 and treated for otitis media with amoxicillin.  He has completed this course, but has not had significant improvement in his pain.  At that time he was also having neck pain and had CT of the neck showing some atherosclerosis.  He denies any further neck pain.  He states that he had cold symptoms of congestion cough for the past month, but these have relatively resolved.  His ear pain is only thing that has continued.  He feels a popping sensation, muffled hearing, voice echoing and head.  He has had some occasional Mucinex.  Denies fevers.  Denies difficulty moving neck.  HPI  Past Medical History:  Diagnosis Date  . Depression   . Diabetes mellitus   . Hypertension     There are no active problems to display for this patient.   Past Surgical History:  Procedure Laterality Date  . BACK SURGERY         Home Medications    Prior to Admission medications   Medication Sig Start Date End Date Taking? Authorizing Provider  acetaminophen (TYLENOL) 500 MG tablet Take 1,000 mg by mouth every 6 (six) hours as needed for mild pain.    [provider]  azithromycin (ZITHROMAX) 250 MG tablet Take 1 tablet (250 mg total) by mouth daily. Take first 2 tablets together, then 1 every day until finished. 10/25/16   Nelva Nay, MD  Cetirizine HCl 10 MG CAPS Take 1 capsule (10 mg total) by mouth daily for 10 days. 09/23/18 10/03/18  Tayna Smethurst C, PA-C  fluticasone (FLONASE) 50 MCG/ACT nasal spray Place 1-2 sprays into both nostrils daily for 10 days. 09/23/18 10/03/18  Remijio Holleran,  Dalina Samara C, PA-C  guaifenesin (ROBITUSSIN) 100 MG/5ML syrup Take 200 mg by mouth 3 (three) times daily as needed for cough.    [provider]  HYDROcodone-homatropine (HYCODAN) 5-1.5 MG/5ML syrup Take 5 mLs by mouth every 6 (six) hours as needed for cough. 10/25/16   Nelva Nay, MD  lisinopril (PRINIVIL,ZESTRIL) 20 MG tablet Take 1 tablet (20 mg total) by mouth daily. 07/08/18   Wurst, [place], PA-C  metFORMIN (GLUCOPHAGE) 1000 MG tablet Take 1 tablet (1,000 mg total) by mouth 2 (two) times daily with a meal. 04/02/13   [name], MD    Family History History reviewed. No pertinent family history.  Social History Social History   Tobacco Use  . Smoking status: Never Smoker  . Smokeless tobacco: Never Used  Substance Use Topics  . Alcohol use: Yes    Comment: 2-3 cans beer/night  . Drug use: No     Allergies   Patient has no known allergies.   Review of Systems Review of Systems  Constitutional: Negative for activity change, appetite change, chills, fatigue and fever.  HENT: Positive for ear pain. Negative for congestion, ear discharge, rhinorrhea, sinus pressure, sore throat and trouble swallowing.   Eyes: Negative for discharge and redness.  Respiratory: Negative for cough, chest tightness and shortness of breath.   Cardiovascular: Negative for chest  pain.  Gastrointestinal: Negative for abdominal pain, diarrhea, nausea and vomiting.  Musculoskeletal: Negative for myalgias.  Skin: Negative for rash.  Neurological: Negative for dizziness, light-headedness and headaches.     Physical Exam Triage Vital Signs ED Triage Vitals  Enc Vitals Group     BP 09/23/18 1231 (!) 160/92     Pulse Rate 09/23/18 1231 83     Resp 09/23/18 1231 18     Temp 09/23/18 1231 98.4 F (36.9 C)     Temp src --      SpO2 09/23/18 1231 100 %     Weight 09/23/18 1230 165 lb (74.8 kg)     Height --      Head Circumference --      Peak Flow --      Pain Score 09/23/18 1230 4      Pain Loc --      Pain Edu? --      Excl. in GC? --    No data found.  Updated Vital Signs BP (!) 160/92 (BP Location: Left Arm)   Pulse 83   Temp 98.4 F (36.9 C)   Resp 18   Wt 165 lb (74.8 kg)   SpO2 100%   BMI 24.72 kg/m   Visual Acuity Right Eye Distance:   Left Eye Distance:   Bilateral Distance:    Right Eye Near:   Left Eye Near:    Bilateral Near:     Physical Exam Vitals signs and nursing note reviewed.  Constitutional:      General: He is not in acute distress.    Appearance: He is well-developed. He is not toxic-appearing.  HENT:     Head: Normocephalic and atraumatic.     Ears:     Comments: Left TM nonerythematous, good cone of light Right TM slightly erythematous to superior aspect, but overall TM nonerythematous and good cone of light, not bulging    Nose:     Comments: Nasal turbinates slightly swollen, nasal mucosa pink, no rhinorrhea present    Mouth/Throat:     Comments: Oral mucosa pink and moist, no tonsillar enlargement or exudate. Posterior pharynx patent and nonerythematous, no uvula deviation or swelling. Normal phonation. Eyes:     Conjunctiva/sclera: Conjunctivae normal.  Neck:     Musculoskeletal: Neck supple.  Cardiovascular:     Rate and Rhythm: Normal rate and regular rhythm.     Heart sounds: No murmur.  Pulmonary:     Effort: Pulmonary effort is normal. No respiratory distress.     Breath sounds: Normal breath sounds.     Comments: Breathing comfortably at rest, CTABL, no wheezing, rales or other adventitious sounds auscultated Abdominal:     Palpations: Abdomen is soft.     Tenderness: There is no abdominal tenderness.  Skin:    General: Skin is warm and dry.  Neurological:     Mental Status: He is alert.      UC Treatments / Results  Labs (all labs ordered are listed, but only abnormal results are displayed) Labs Reviewed - No data to display  EKG None  Radiology No results found.  Procedures Procedures  (including critical care time)  Medications Ordered in UC Medications - No data to display  Initial Impression / Assessment and Plan / UC Course  I have reviewed the triage vital signs and the nursing notes.  Pertinent labs & imaging results that were available during my care of the patient were reviewed by me and considered in  my medical decision making (see chart for details).    Patient with right otalgia, possible eustachian tube dysfunction.  Previous treatment with amoxicillin for otitis media, TMs appear relatively normal.  Will try Flonase and Zyrtec.  Follow-up with ENT if symptoms persisting.  Follow-up here emergency room if symptoms worsening, changing,Discussed strict return precautions. Patient verbalized understanding and is agreeable with plan.  Final Clinical Impressions(s) / UC Diagnoses   Final diagnoses:  Otalgia of right ear  Eustachian tube dysfunction, right     Discharge Instructions     Discomfort in ear likely from poor drainage and congestion  Please use Flonase nasal spray 1 to 2 sprays in each nostril daily May also try using daily Zyrtec to further help with any congestion  Tylenol for pain  Follow-up here in emergency room if developing neck stiffness, swelling, worsening pain, fevers  Follow-up with ENT if symptoms persisting   ED Prescriptions    Medication Sig Dispense Auth. Provider   fluticasone (FLONASE) 50 MCG/ACT nasal spray Place 1-2 sprays into both nostrils daily for 10 days. 1 g Rosabella Edgin C, PA-C   Cetirizine HCl 10 MG CAPS Take 1 capsule (10 mg total) by mouth daily for 10 days. 15 capsule Sherman Donaldson C, PA-C     Controlled Substance Prescriptions [place] Controlled Substance Registry consulted? Not Applicable   Lew Dawes, [place] 09/23/18 1307

## 2018-10-07 ENCOUNTER — Ambulatory Visit (HOSPITAL_COMMUNITY)
Admission: EM | Admit: 2018-10-07 | Discharge: 2018-10-07 | Disposition: A | Discharge status: Home / Self Care | Payer: BLUE CROSS/BLUE SHIELD | Attending: Family Medicine | Admitting: Family Medicine

## 2018-10-07 ENCOUNTER — Encounter (HOSPITAL_COMMUNITY): Payer: Self-pay

## 2018-10-07 DIAGNOSIS — R040 Epistaxis: Secondary | ICD-10-CM | POA: Insufficient documentation

## 2018-10-07 DIAGNOSIS — I10 Essential (primary) hypertension: Secondary | ICD-10-CM | POA: Insufficient documentation

## 2018-10-07 DIAGNOSIS — Z760 Encounter for issue of repeat prescription: Secondary | ICD-10-CM

## 2018-10-07 DIAGNOSIS — E119 Type 2 diabetes mellitus without complications: Secondary | ICD-10-CM

## 2018-10-07 MED ORDER — LISINOPRIL-HYDROCHLOROTHIAZIDE 20-12.5 MG PO TABS: 1.0000 | ORAL_TABLET | Freq: Every day | ORAL | 0 refills | Status: DC

## 2018-10-07 MED ORDER — METFORMIN HCL 1000 MG PO TABS: 1000.0000 mg | ORAL_TABLET | Freq: Two times a day (BID) | ORAL | 0 refills | Status: DC

## 2018-10-07 NOTE — Discharge Instructions (Signed)
Take blood pressure pills once a day every day Take the metformin 2 times a day Follow a diabetic diet Limit your beer intake Increase water in your diet Use salt water sprays into your nose, consider putting Vaseline in your nose to keep it soft [facility] to set up an appointment within the next month

## 2018-10-07 NOTE — ED Triage Notes (Signed)
Pt presents with complaints of elevated blood pressure at home. State she ran out of his blood pressure and diabetic medication this week. Pt also endorses continued cold like symptoms and nose bleeds at home. Pt is actively trying to obtain pcp.

## 2018-10-07 NOTE — ED Provider Notes (Signed)
[facility]    CSN: [id number] Arrival date & time: 10/07/18  1004     History   Chief Complaint Chief Complaint  Patient presents with  . Hypertension  . Epistaxis  . URI    HPI Nathan Duran is a 57 y.o. male.   HPI  Patient is out of his medications.  He is here predominantly for a medicine refill.  He needs his diabetes medicines as well as his blood pressure medicine.  Blood pressures been high.  Even on his blood pressure medicine his blood pressure control was not good.  He has not had a PCP for some time.  The importance of finding PCP is discussed with him.  [facility]  [facility] taking [facility].  I will refill his medications for him at this time.  Past Medical History:  Diagnosis Date  . Depression   . Diabetes mellitus   . Hypertension     There are no active problems to display for this patient.   Past Surgical History:  Procedure Laterality Date  . BACK SURGERY         Home Medications    Prior to Admission medications   Medication Sig Start Date End Date Taking? Authorizing Provider  acetaminophen (TYLENOL) 500 MG tablet Take 1,000 mg by mouth every 6 (six) hours as needed for mild pain.    [provider]  lisinopril-hydrochlorothiazide (ZESTORETIC) 20-12.5 MG tablet Take 1 tablet by mouth daily. 10/07/18   Eustace MooreNelson, Annye Forrey Sue, MD  metFORMIN (GLUCOPHAGE) 1000 MG tablet Take 1 tablet (1,000 mg total) by mouth 2 (two) times daily with a meal. 10/07/18   Eustace MooreNelson, Carry Weesner Sue, MD    Family History Family History  Problem Relation Age of Onset  . Hypertension Mother   . Diabetes Mother   . Diabetes Father   . Hypertension Father     Social History Social History   Tobacco Use  . Smoking status: Never Smoker  . Smokeless tobacco: Never Used  Substance Use Topics  . Alcohol use: Yes    Comment: 2-3 cans beer/night  . Drug use: No     Allergies   Patient has no known  allergies.   Review of Systems Review of Systems  Constitutional: Negative for chills and fever.  HENT: Negative for ear pain and sore throat.   Eyes: Negative for pain and visual disturbance.  Respiratory: Negative for cough and shortness of breath.   Cardiovascular: Negative for chest pain and palpitations.  Gastrointestinal: Negative for abdominal pain and vomiting.  Genitourinary: Negative for dysuria and hematuria.  Musculoskeletal: Negative for arthralgias and back pain.  Skin: Negative for color change and rash.  Neurological: Negative for seizures and syncope.  All other systems reviewed and are negative.    Physical Exam Triage Vital Signs ED Triage Vitals  Enc Vitals Group     BP 10/07/18 1119 (!) 175/108     Pulse Rate 10/07/18 1119 85     Resp 10/07/18 1119 20     Temp 10/07/18 1119 98 F (36.7 C)     Temp src --      SpO2 10/07/18 1119 99 %     Weight --      Height --      Head Circumference --      Peak Flow --      Pain Score 10/07/18 1115 4     Pain Loc --  Pain Edu? --      Excl. in GC? --    No data found.  Updated Vital Signs BP (!) 175/108   Pulse 85   Temp 98 F (36.7 C)   Resp 20   SpO2 99%       Physical Exam Constitutional:      General: He is not in acute distress.    Appearance: He is well-developed.  HENT:     Head: Normocephalic and atraumatic.     Nose: Congestion present.  Eyes:     Conjunctiva/sclera: Conjunctivae normal.     Pupils: Pupils are equal, round, and reactive to light.  Neck:     Musculoskeletal: Normal range of motion.  Cardiovascular:     Rate and Rhythm: Normal rate and regular rhythm.     Heart sounds: Normal heart sounds.  Pulmonary:     Effort: Pulmonary effort is normal. No respiratory distress.     Breath sounds: Normal breath sounds.  Abdominal:     General: There is no distension.     Palpations: Abdomen is soft.  Musculoskeletal: Normal range of motion.  Skin:    General: Skin is warm  and dry.  Neurological:     Mental Status: He is alert.      UC Treatments / Results  Labs (all labs ordered are listed, but only abnormal results are displayed) Labs Reviewed - No data to display  EKG None  Radiology No results found.  Procedures Procedures (including critical care time)  Medications Ordered in UC Medications - No data to display  Initial Impression / Assessment and Plan / UC Course  I have reviewed the triage vital signs and the nursing notes.  Pertinent labs & imaging results that were available during my care of the patient were reviewed by me and considered in my medical decision making (see chart for details).     Patient's blood pressure is elevated but not dangerous.  He needs to get back on his medications.  Importance of primary care is reviewed Final Clinical Impressions(s) / UC Diagnoses   Final diagnoses:  Essential hypertension  Type 2 diabetes mellitus without complication, without long-term current use of insulin (HCC)  Medicine refill  Epistaxis     Discharge Instructions     Take blood pressure pills once a day every day Take the metformin 2 times a day Follow a diabetic diet Limit your beer intake Increase water in your diet Use salt water sprays into your nose, consider putting Vaseline in your nose to keep it soft [facility] to set up an appointment within the next month   ED Prescriptions    Medication Sig Dispense Auth. Provider   lisinopril-hydrochlorothiazide (ZESTORETIC) 20-12.5 MG tablet Take 1 tablet by mouth daily. 30 tablet Eustace MooreNelson, Andersyn Fragoso Sue, MD   metFORMIN (GLUCOPHAGE) 1000 MG tablet Take 1 tablet (1,000 mg total) by mouth 2 (two) times daily with a meal. 60 tablet Eustace MooreNelson, Lekia Nier Sue, MD     Controlled Substance Prescriptions [place] Controlled Substance Registry consulted? Not Applicable   Eustace MooreNelson, Sylvio Weatherall Sue, MD 10/07/18 2154

## 2018-12-26 ENCOUNTER — Encounter (HOSPITAL_COMMUNITY): Payer: Self-pay | Admitting: Emergency Medicine

## 2018-12-26 ENCOUNTER — Ambulatory Visit (HOSPITAL_COMMUNITY)
Admission: EM | Admit: 2018-12-26 | Discharge: 2018-12-26 | Disposition: A | Discharge status: Home / Self Care | Payer: BLUE CROSS/BLUE SHIELD | Attending: Family Medicine | Admitting: Family Medicine

## 2018-12-26 ENCOUNTER — Other Ambulatory Visit: Payer: Self-pay

## 2018-12-26 ENCOUNTER — Ambulatory Visit (INDEPENDENT_AMBULATORY_CARE_PROVIDER_SITE_OTHER): Payer: BLUE CROSS/BLUE SHIELD

## 2018-12-26 DIAGNOSIS — R05 Cough: Secondary | ICD-10-CM

## 2018-12-26 DIAGNOSIS — J22 Unspecified acute lower respiratory infection: Secondary | ICD-10-CM

## 2018-12-26 MED ORDER — AZITHROMYCIN 250 MG PO TABS: ORAL_TABLET | ORAL | 0 refills | Status: AC

## 2018-12-26 MED ORDER — BENZONATATE 100 MG PO CAPS: 100.0000 mg | ORAL_CAPSULE | Freq: Three times a day (TID) | ORAL | 0 refills | Status: DC

## 2018-12-26 MED ORDER — IPRATROPIUM BROMIDE 0.06 % NA SOLN: 2.0000 | Freq: Four times a day (QID) | NASAL | 12 refills | Status: DC

## 2018-12-26 MED ORDER — LISINOPRIL-HYDROCHLOROTHIAZIDE 20-12.5 MG PO TABS: 1.0000 | ORAL_TABLET | Freq: Every day | ORAL | 0 refills | Status: DC

## 2018-12-26 NOTE — Discharge Instructions (Signed)
Push fluids to ensure adequate hydration and keep secretions thin.  Tylenol and/or ibuprofen as needed for pain or fevers.  Complete course of antibiotics.  Tessalon as needed for cough.  Nasal spray as needed for congestion.  Please restart BP medications.  Please establish with a primary care provider for management of your medications and hypertension.  If develop worsening of symptoms, cough, chest pain , shortness of breath please return or go to the ER.

## 2018-12-26 NOTE — ED Provider Notes (Signed)
[facility]    CSN: [id number] Arrival date & time: 12/26/18  1050     History   Chief Complaint Chief Complaint  Patient presents with  . URI    HPI Nathan Duran is a 58 y.o. male.   HPI  Past Medical History:  Diagnosis Date  . Depression   . Diabetes mellitus   . Hypertension     There are no active problems to display for this patient.   Past Surgical History:  Procedure Laterality Date  . BACK SURGERY         Home Medications    Prior to Admission medications   Medication Sig Start Date End Date Taking? Authorizing Provider  acetaminophen (TYLENOL) 500 MG tablet Take 1,000 mg by mouth every 6 (six) hours as needed for mild pain.    [provider]  azithromycin (ZITHROMAX) 250 MG tablet Take 2 tablets (500 mg total) by mouth daily for 1 day, THEN 1 tablet (250 mg total) daily for 4 days. 12/26/18 12/31/18  Georgetta Haber, NP  benzonatate (TESSALON) 100 MG capsule Take 1 capsule (100 mg total) by mouth every 8 (eight) hours. 12/26/18   Linus Mako B, NP  ipratropium (ATROVENT) 0.06 % nasal spray Place 2 sprays into both nostrils 4 (four) times daily. 12/26/18   Georgetta Haber, NP  lisinopril-hydrochlorothiazide (ZESTORETIC) 20-12.5 MG tablet Take 1 tablet by mouth daily. 12/26/18   Georgetta Haber, NP  metFORMIN (GLUCOPHAGE) 1000 MG tablet Take 1 tablet (1,000 mg total) by mouth 2 (two) times daily with a meal. 10/07/18   Eustace Moore, MD    Family History Family History  Problem Relation Age of Onset  . Hypertension Mother   . Diabetes Mother   . Diabetes Father   . Hypertension Father     Social History Social History   Tobacco Use  . Smoking status: Never Smoker  . Smokeless tobacco: Never Used  Substance Use Topics  . Alcohol use: Yes    Comment: 2-3 cans beer/night  . Drug use: No     Allergies   Patient has no known allergies.   Review of Systems Review of Systems   Physical Exam Triage Vital  Signs ED Triage Vitals  Enc Vitals Group     BP 12/26/18 1129 (!) 180/101     Pulse Rate 12/26/18 1129 (!) 101     Resp 12/26/18 1129 18     Temp 12/26/18 1129 97.7 F (36.5 C)     Temp Source 12/26/18 1129 Temporal     SpO2 12/26/18 1129 100 %     Weight --      Height --      Head Circumference --      Peak Flow --      Pain Score 12/26/18 1130 5     Pain Loc --      Pain Edu? --      Excl. in GC? --    No data found.  Updated Vital Signs BP (!) 180/101 (BP Location: Right Arm)   Pulse (!) 101   Temp 97.7 F (36.5 C) (Temporal)   Resp 18   SpO2 100%    Physical Exam Vitals signs reviewed.  Constitutional:      Appearance: He is well-developed.  HENT:     Head: Normocephalic and atraumatic.     Right Ear: Tympanic membrane, ear canal and external ear normal.     Left Ear: Tympanic membrane, ear  canal and external ear normal.     Nose: Congestion present.     Right Sinus: No maxillary sinus tenderness or frontal sinus tenderness.     Left Sinus: No maxillary sinus tenderness or frontal sinus tenderness.     Mouth/Throat:     Pharynx: Uvula midline.  Eyes:     Conjunctiva/sclera: Conjunctivae normal.     Pupils: Pupils are equal, round, and reactive to light.  Neck:     Musculoskeletal: Normal range of motion.  Cardiovascular:     Rate and Rhythm: Normal rate and regular rhythm.  Pulmonary:     Effort: Pulmonary effort is normal.     Breath sounds: Normal breath sounds.     Comments: Voice hoarseness noted; occasional congested cough Lymphadenopathy:     Cervical: No cervical adenopathy.  Skin:    General: Skin is warm and dry.  Neurological:     Mental Status: He is alert and oriented to person, place, and time.      UC Treatments / Results  Labs (all labs ordered are listed, but only abnormal results are displayed) Labs Reviewed - No data to display  EKG None  Radiology Dg Chest 2 View  Result Date: 12/26/2018 CLINICAL DATA:  Cough for 2  weeks. EXAM: CHEST - 2 VIEW COMPARISON:  11/14/2015 FINDINGS: Both lungs are clear. Heart and mediastinum are within normal limits. Trachea is midline. Negative for a pneumothorax. No large pleural effusions. Bone structures are unremarkable. IMPRESSION: No active cardiopulmonary disease. Electronically Signed   By: Richarda Overlie M.D.   On: 12/26/2018 12:23    Procedures Procedures (including critical care time)  Medications Ordered in UC Medications - No data to display  Initial Impression / Assessment and Plan / UC Course  I have reviewed the triage vital signs and the nursing notes.  Pertinent labs & imaging results that were available during my care of the patient were reviewed by me and considered in my medical decision making (see chart for details).     Mild tachycardia in presence of 2 weeks of cough and congestion. Xray reassuring here today, no tachypnea or hypoxia. Will cover for atypicals with azithromycin. bp medication refilled for 1 month and encouraged to follow up with pcp for management. Return precautions provided. Patient verbalized understanding and agreeable to plan.  Ambulatory out of clinic without difficulty.    Final Clinical Impressions(s) / UC Diagnoses   Final diagnoses:  Lower respiratory infection     Discharge Instructions     Push fluids to ensure adequate hydration and keep secretions thin.  Tylenol and/or ibuprofen as needed for pain or fevers.  Complete course of antibiotics.  Tessalon as needed for cough.  Nasal spray as needed for congestion.  Please restart BP medications.  Please establish with a primary care provider for management of your medications and hypertension.  If develop worsening of symptoms, cough, chest pain , shortness of breath please return or go to the ER.     ED Prescriptions    Medication Sig Dispense Auth. Provider   azithromycin (ZITHROMAX) 250 MG tablet Take 2 tablets (500 mg total) by mouth daily for 1 day, THEN 1  tablet (250 mg total) daily for 4 days. 6 tablet Linus Mako B, NP   benzonatate (TESSALON) 100 MG capsule Take 1 capsule (100 mg total) by mouth every 8 (eight) hours. 21 capsule Linus Mako B, NP   ipratropium (ATROVENT) 0.06 % nasal spray Place 2 sprays into both nostrils 4 (  four) times daily. 15 mL Linus Mako B, NP   lisinopril-hydrochlorothiazide (ZESTORETIC) 20-12.5 MG tablet Take 1 tablet by mouth daily. 30 tablet Georgetta Haber, NP     Controlled Substance Prescriptions [place] Controlled Substance Registry consulted? Not Applicable   Georgetta Haber, NP 12/26/18 1237

## 2018-12-26 NOTE — ED Triage Notes (Signed)
Pt here for URI sx x 2 weeks  

## 2019-02-21 ENCOUNTER — Other Ambulatory Visit: Payer: Self-pay

## 2019-02-21 ENCOUNTER — Encounter: Payer: Self-pay | Admitting: Emergency Medicine

## 2019-02-21 ENCOUNTER — Ambulatory Visit
Admission: EM | Admit: 2019-02-21 | Discharge: 2019-02-21 | Disposition: A | Discharge status: Home / Self Care | Payer: BLUE CROSS/BLUE SHIELD | Attending: Emergency Medicine | Admitting: Emergency Medicine

## 2019-02-21 DIAGNOSIS — R509 Fever, unspecified: Secondary | ICD-10-CM

## 2019-02-21 DIAGNOSIS — B349 Viral infection, unspecified: Secondary | ICD-10-CM | POA: Diagnosis not present

## 2019-02-21 DIAGNOSIS — Z760 Encounter for issue of repeat prescription: Secondary | ICD-10-CM

## 2019-02-21 MED ORDER — LISINOPRIL-HYDROCHLOROTHIAZIDE 20-12.5 MG PO TABS: 1.0000 | ORAL_TABLET | Freq: Every day | ORAL | 0 refills | Status: DC

## 2019-02-21 MED ORDER — METFORMIN HCL 1000 MG PO TABS: 1000.0000 mg | ORAL_TABLET | Freq: Two times a day (BID) | ORAL | 0 refills | Status: DC

## 2019-02-21 NOTE — Discharge Instructions (Addendum)
Push fluids to ensure adequate hydration.  Tylenol OTC as needed for fever.   Please restart BP and DM medications.  Please establish with a primary care provider for management of your diabetic and hypertension medications.  If you develop worsening of symptoms, cough, chest pain, shortness of breath please return or go to the ER.

## 2019-02-21 NOTE — ED Triage Notes (Signed)
Pt presents to [facility] for assessment of 2 weeks of high BP.  Pt states he has been "drinking again", but his last drink was Sunday morning.  Pt states he is unable to get a PCP right now due to having to work so many hours at work.

## 2019-02-21 NOTE — ED Notes (Signed)
Patient able to ambulate independently  

## 2019-02-21 NOTE — ED Provider Notes (Addendum)
[facility]    CSN: [id number] Arrival date & time: 02/21/19  1438     History   Chief Complaint Chief Complaint  Patient presents with  . Medication Refill    HPI Nathan Duran is a 58 y.o. male presenting for medication refill for DM and HTN.  Patient does not have a PCP, states he is able to check his BP at home: states BP was elevated w/ diastolic of 88, cannot remember systolic.  Patient has been out of his medications for a few days, running low on his metformin as well though states he is taking this daily as written.  Of note, patient is diaphoretic and febrile in office today.  O2 initially at 91% though throughout visit maintained at 95% on room air.  Patient states that a coworker has been out sick for 14 days; unknown if COVID testing performed.  Patient states that he developed fever and body aches on Saturday.  Denies congestion, cough, SOB.  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  Denies chest pain/pressure at rest or with exertion.      Past Medical History:  Diagnosis Date  . Depression   . Diabetes mellitus   . Hypertension     There are no active problems to display for this patient.   Past Surgical History:  Procedure Laterality Date  . BACK SURGERY         Home Medications    Prior to Admission medications   Medication Sig Start Date End Date Taking? Authorizing Provider  acetaminophen (TYLENOL) 500 MG tablet Take 1,000 mg by mouth every 6 (six) hours as needed for mild pain.    [provider]  benzonatate (TESSALON) 100 MG capsule Take 1 capsule (100 mg total) by mouth every 8 (eight) hours. 12/26/18   Linus Mako B, NP  ipratropium (ATROVENT) 0.06 % nasal spray Place 2 sprays into both nostrils 4 (four) times daily. 12/26/18   Georgetta Haber, NP  lisinopril-hydrochlorothiazide (ZESTORETIC) 20-12.5 MG tablet Take 1 tablet  by mouth daily. 02/21/19   Hall-Potvin, [place], PA-C  metFORMIN (GLUCOPHAGE) 1000 MG tablet Take 1 tablet (1,000 mg total) by mouth 2 (two) times daily with a meal. 02/21/19   Hall-Potvin, [place], PA-C    Family History Family History  Problem Relation Age of Onset  . Hypertension Mother   . Diabetes Mother   . Diabetes Father   . Hypertension Father     Social History Social History   Tobacco Use  . Smoking status: Never Smoker  . Smokeless tobacco: Never Used  Substance Use Topics  . Alcohol use: Yes    Comment: 2-3 cans beer/night  . Drug use: No     Allergies   Patient has no known allergies.   Review of Systems Review of Systems  Constitutional: Positive for diaphoresis and fever. Negative for appetite change.  HENT: Negative for congestion and sore throat.   Respiratory: Negative for cough, chest tightness, shortness of breath and wheezing.   Cardiovascular: Positive for chest pain. Negative for palpitations.  Gastrointestinal: Negative for abdominal pain, diarrhea, nausea and vomiting.  Musculoskeletal: Positive for arthralgias (resolved).     Physical Exam Triage Vital Signs ED Triage Vitals  Enc Vitals Group     BP 02/21/19 1449 (!) 135/92     Pulse Rate 02/21/19 1449 (!) 105     Resp 02/21/19 1449 18  Temp 02/21/19 1449 (!) 100.6 F (38.1 C)     Temp Source 02/21/19 1449 Oral     SpO2 02/21/19 1449 94 %     Weight --      Height --      Head Circumference --      Peak Flow --      Pain Score 02/21/19 1450 0     Pain Loc --      Pain Edu? --      Excl. in GC? --    No data found.  Updated Vital Signs BP (!) 135/92 (BP Location: Left Arm)   Pulse (!) 105   Temp (!) 100.6 F (38.1 C) (Oral)   Resp 18   SpO2 94%   Visual Acuity Right Eye Distance:   Left Eye Distance:   Bilateral Distance:    Right Eye Near:   Left Eye Near:    Bilateral Near:     Physical Exam Constitutional:      Appearance: He is diaphoretic. He is not  toxic-appearing.  HENT:     Head: Normocephalic and atraumatic.     Mouth/Throat:     Mouth: Mucous membranes are moist.     Pharynx: No oropharyngeal exudate or posterior oropharyngeal erythema.  Eyes:     General: No scleral icterus.    Pupils: Pupils are equal, round, and reactive to light.  Neck:     Musculoskeletal: Neck supple.  Cardiovascular:     Rate and Rhythm: Regular rhythm. Tachycardia present.  Pulmonary:     Effort: Pulmonary effort is normal. No respiratory distress.     Breath sounds: No wheezing, rhonchi or rales.  Chest:     Chest wall: No tenderness.  Lymphadenopathy:     Cervical: No cervical adenopathy.  Neurological:     General: No focal deficit present.     Mental Status: He is alert and oriented to person, place, and time.      UC Treatments / Results  Labs (all labs ordered are listed, but only abnormal results are displayed) Labs Reviewed - No data to display  EKG None  Radiology No results found.  Procedures Procedures (including critical care time)  Medications Ordered in UC Medications - No data to display  Initial Impression / Assessment and Plan / UC Course  I have reviewed the triage vital signs and the nursing notes.  Pertinent labs & imaging results that were available during my care of the patient were reviewed by me and considered in my medical decision making (see chart for details).   Patient came in for chronic med refills, was noted to be diaphoretic/febrile.  Possible exposure to COVID, discussed importance of isolation and hygiene precautions as recommended by CDC.  Return precautions given, patient verbalized understanding.  Patient to establish care w/ PCP for better continuity and management of chronic conditions (DM, HTN).  EKG done in office due to history of chest pain that has increased in frequency: sinus rhythm, tachycardic without evidence of ischemia; no significant changes from prior EKG (Sept 2019).  Final  Clinical Impressions(s) / UC Diagnoses   Final diagnoses:  Encounter for medication refill  Fever, unspecified fever cause  Viral illness     Discharge Instructions     Push fluids to ensure adequate hydration.  Tylenol OTC as needed for fever.   Please restart BP and DM medications.  Please establish with a primary care provider for management of your diabetic and hypertension medications.  If you  develop worsening of symptoms, cough, chest pain, shortness of breath please return or go to the ER.     ED Prescriptions    Medication Sig Dispense Auth. Provider   lisinopril-hydrochlorothiazide (ZESTORETIC) 20-12.5 MG tablet Take 1 tablet by mouth daily. 30 tablet Hall-Potvin, [place], PA-C   metFORMIN (GLUCOPHAGE) 1000 MG tablet Take 1 tablet (1,000 mg total) by mouth 2 (two) times daily with a meal. 60 tablet Hall-Potvin, [place], PA-C     Controlled Substance Prescriptions [place] Controlled Substance Registry consulted? Not Applicable   Shea Evans, PA-C 02/21/19 1601    Hall-Potvin, [place], [place] 02/21/19 1806

## 2019-10-12 ENCOUNTER — Other Ambulatory Visit: Payer: Self-pay

## 2019-10-12 ENCOUNTER — Ambulatory Visit (HOSPITAL_COMMUNITY)
Admission: EM | Admit: 2019-10-12 | Discharge: 2019-10-12 | Disposition: A | Discharge status: Home / Self Care | Payer: BC Managed Care – PPO | Attending: Emergency Medicine | Admitting: Emergency Medicine

## 2019-10-12 ENCOUNTER — Encounter (HOSPITAL_COMMUNITY): Payer: Self-pay

## 2019-10-12 DIAGNOSIS — E119 Type 2 diabetes mellitus without complications: Secondary | ICD-10-CM | POA: Diagnosis not present

## 2019-10-12 DIAGNOSIS — I10 Essential (primary) hypertension: Secondary | ICD-10-CM | POA: Diagnosis not present

## 2019-10-12 DIAGNOSIS — Z760 Encounter for issue of repeat prescription: Secondary | ICD-10-CM | POA: Insufficient documentation

## 2019-10-12 LAB — BASIC METABOLIC PANEL
Anion gap: 9 (ref 5–15)
BUN: 10 mg/dL (ref 6–20)
CO2: 28 mmol/L (ref 22–32)
Calcium: 9.2 mg/dL (ref 8.9–10.3)
Chloride: 101 mmol/L (ref 98–111)
Creatinine, Ser: 0.86 mg/dL (ref 0.61–1.24)
GFR calc Af Amer: >60 mL/min (ref >60)
GFR calc non Af Amer: >60 mL/min (ref >60)
Glucose, Bld: 225 mg/dL — ABNORMAL HIGH (ref 70–99)
Potassium: 3.9 mmol/L (ref 3.5–5.1)
Sodium: 138 mmol/L (ref 135–145)

## 2019-10-12 MED ORDER — METFORMIN HCL 1000 MG PO TABS: 1000.0000 mg | ORAL_TABLET | Freq: Two times a day (BID) | ORAL | 0 refills | Status: DC

## 2019-10-12 MED ORDER — LISINOPRIL-HYDROCHLOROTHIAZIDE 20-12.5 MG PO TABS: 1.0000 | ORAL_TABLET | Freq: Every day | ORAL | 0 refills | Status: DC

## 2019-10-12 NOTE — ED Triage Notes (Signed)
Pt states he needs a med refill on his B/P and diabetes meds. Pt states he has been out of his meds for a while.

## 2019-10-12 NOTE — Discharge Instructions (Signed)
I have refilled your blood pressure medicine and Metformin.  Please restart taking as prescribed. Please continue your efforts to establish care with a primary care for better management of your blood pressure and diabetes. I am checking blood work to check your electrolytes and kidney function, I will only call if this is abnormal  Please follow-up if developing any chest pain, shortness of breath, headaches, vision changes, difficulty speaking or weakness

## 2019-10-14 NOTE — ED Provider Notes (Signed)
[facility]    CSN: [id number] Arrival date & time: 10/12/19  1422      History   Chief Complaint Chief Complaint  Patient presents with  . Medication Refill    HPI Nathan Duran is a 59 y.o. male history of hypertension, DM type II, presenting today for medication refill of his blood pressure and diabetes medicines.  He has been out of his medicines for approximately 1 month.  Is on Metformin for diabetes as well as lisinopril/HCTZ combo for his blood pressure.  He has been trying to get set up with primary care and notes he has obtained contacts from his insurance and plans to continue to try to set up an appointment, but mainly needs meds to hold him over.  He has had some intermittent mild headaches, denies any current headaches.  Denies any vision changes, double vision.  Denies chest pain or shortness of breath.  He does check his blood sugars at home and notes that they typically run in the 200s to 300s.  Denies tobacco use, endorses occasional alcohol use.  HPI  Past Medical History:  Diagnosis Date  . Depression   . Diabetes mellitus   . Hypertension     There are no problems to display for this patient.   Past Surgical History:  Procedure Laterality Date  . BACK SURGERY         Home Medications    Prior to Admission medications   Medication Sig Start Date End Date Taking? Authorizing Provider  acetaminophen (TYLENOL) 500 MG tablet Take 1,000 mg by mouth every 6 (six) hours as needed for mild pain.    [provider]  benzonatate (TESSALON) 100 MG capsule Take 1 capsule (100 mg total) by mouth every 8 (eight) hours. 12/26/18   Linus Mako B, NP  ipratropium (ATROVENT) 0.06 % nasal spray Place 2 sprays into both nostrils 4 (four) times daily. 12/26/18   Georgetta Haber, NP  lisinopril-hydrochlorothiazide (ZESTORETIC) 20-12.5 MG tablet Take 1 tablet by mouth daily. 10/12/19 12/11/19  Watson Robarge C, PA-C  metFORMIN (GLUCOPHAGE) 1000 MG  tablet Take 1 tablet (1,000 mg total) by mouth 2 (two) times daily with a meal. 10/12/19 12/11/19  Hildegard Hlavac, Junius Creamer, PA-C    Family History Family History  Problem Relation Age of Onset  . Hypertension Mother   . Diabetes Mother   . Diabetes Father   . Hypertension Father     Social History Social History   Tobacco Use  . Smoking status: Never Smoker  . Smokeless tobacco: Never Used  Substance Use Topics  . Alcohol use: Yes    Comment: 2-3 cans beer/night  . Drug use: No     Allergies   Patient has no known allergies.   Review of Systems Review of Systems  Constitutional: Negative for fatigue and fever.  HENT: Negative for congestion, sinus pressure and sore throat.   Eyes: Negative for photophobia, pain and visual disturbance.  Respiratory: Negative for cough and shortness of breath.   Cardiovascular: Negative for chest pain.  Gastrointestinal: Negative for abdominal pain, nausea and vomiting.  Genitourinary: Negative for decreased urine volume and hematuria.  Musculoskeletal: Negative for myalgias, neck pain and neck stiffness.  Neurological: Positive for headaches. Negative for dizziness, syncope, facial asymmetry, speech difficulty, weakness, light-headedness and numbness.     Physical Exam Triage Vital Signs ED Triage Vitals  Enc Vitals Group     BP 10/12/19 1533 (!) 177/114     Pulse  Rate 10/12/19 1533 (!) 101     Resp 10/12/19 1533 18     Temp 10/12/19 1533 98.5 F (36.9 C)     Temp src --      SpO2 10/12/19 1533 98 %     Weight 10/12/19 1526 170 lb (77.1 kg)     Height --      Head Circumference --      Peak Flow --      Pain Score 10/12/19 1526 0     Pain Loc --      Pain Edu? --      Excl. in GC? --    No data found.  Updated Vital Signs BP (!) 177/114 (BP Location: Right Arm)   Pulse (!) 101   Temp 98.5 F (36.9 C)   Resp 18   Wt 170 lb (77.1 kg)   SpO2 98%   BMI 25.47 kg/m   Visual Acuity Right Eye Distance:   Left Eye  Distance:   Bilateral Distance:    Right Eye Near:   Left Eye Near:    Bilateral Near:     Physical Exam Vitals and nursing note reviewed.  Constitutional:      Appearance: He is well-developed.     Comments: No acute distress  HENT:     Head: Normocephalic and atraumatic.     Nose: Nose normal.     Mouth/Throat:     Comments: Oral mucosa pink and moist, no tonsillar enlargement or exudate. Posterior pharynx patent and nonerythematous, no uvula deviation or swelling. Normal phonation.  Eyes:     Extraocular Movements: Extraocular movements intact.     Conjunctiva/sclera: Conjunctivae normal.     Pupils: Pupils are equal, round, and reactive to light.  Cardiovascular:     Rate and Rhythm: Normal rate.     Comments: No carotid bruits auscultated Pulmonary:     Effort: Pulmonary effort is normal. No respiratory distress.     Comments: Breathing comfortably at rest, CTABL, no wheezing, rales or other adventitious sounds auscultated  Abdominal:     General: There is no distension.  Musculoskeletal:        General: Normal range of motion.     Cervical back: Neck supple.  Skin:    General: Skin is warm and dry.  Neurological:     Mental Status: He is alert and oriented to person, place, and time.      UC Treatments / Results  Labs (all labs ordered are listed, but only abnormal results are displayed) Labs Reviewed  BASIC METABOLIC PANEL - Abnormal; Notable for the following components:      Result Value   Glucose, Bld 225 (*)    All other components within normal limits    EKG   Radiology No results found.  Procedures Procedures (including critical care time)  Medications Ordered in UC Medications - No data to display  Initial Impression / Assessment and Plan / UC Course  I have reviewed the triage vital signs and the nursing notes.  Pertinent labs & imaging results that were available during my care of the patient were reviewed by me and considered in my  medical decision making (see chart for details).     Checking BMP for potassium and kidney function.  Will refill Metformin and lisinopril/HCTZ.  Discussed and continue to stress importance of establishing care with a primary care for better management of his diabetes/blood pressure as well as preventative care.  Discussed strict return precautions. Patient  verbalized understanding and is agreeable with plan.  Final Clinical Impressions(s) / UC Diagnoses   Final diagnoses:  Medication refill  Essential hypertension  Type 2 diabetes mellitus without complication, without long-term current use of insulin ([place])     Discharge Instructions     I have refilled your blood pressure medicine and Metformin.  Please restart taking as prescribed. Please continue your efforts to establish care with a primary care for better management of your blood pressure and diabetes. I am checking blood work to check your electrolytes and kidney function, I will only call if this is abnormal  Please follow-up if developing any chest pain, shortness of breath, headaches, vision changes, difficulty speaking or weakness   ED Prescriptions    Medication Sig Dispense Auth. Provider   metFORMIN (GLUCOPHAGE) 1000 MG tablet Take 1 tablet (1,000 mg total) by mouth 2 (two) times daily with a meal. 120 tablet Wafaa Deemer C, PA-C   lisinopril-hydrochlorothiazide (ZESTORETIC) 20-12.5 MG tablet Take 1 tablet by mouth daily. 60 tablet Eithel Ryall, [place] C, PA-C     PDMP not reviewed this encounter.   Janith Lima, [place] 10/14/19 1312

## 2019-11-19 ENCOUNTER — Ambulatory Visit (INDEPENDENT_AMBULATORY_CARE_PROVIDER_SITE_OTHER): Payer: BC Managed Care – PPO

## 2019-11-19 ENCOUNTER — Encounter (HOSPITAL_COMMUNITY): Payer: Self-pay

## 2019-11-19 ENCOUNTER — Ambulatory Visit (HOSPITAL_COMMUNITY)
Admission: EM | Admit: 2019-11-19 | Discharge: 2019-11-19 | Disposition: A | Discharge status: Home / Self Care | Payer: BC Managed Care – PPO | Attending: Emergency Medicine | Admitting: Emergency Medicine

## 2019-11-19 ENCOUNTER — Other Ambulatory Visit: Payer: Self-pay

## 2019-11-19 DIAGNOSIS — S161XXA Strain of muscle, fascia and tendon at neck level, initial encounter: Secondary | ICD-10-CM

## 2019-11-19 DIAGNOSIS — S86012A Strain of left Achilles tendon, initial encounter: Secondary | ICD-10-CM

## 2019-11-19 MED ORDER — IBUPROFEN 800 MG PO TABS: 800.0000 mg | ORAL_TABLET | Freq: Three times a day (TID) | ORAL | 0 refills | Status: DC

## 2019-11-19 MED ORDER — CYCLOBENZAPRINE HCL 5 MG PO TABS: 5.0000 mg | ORAL_TABLET | Freq: Two times a day (BID) | ORAL | 0 refills | Status: DC | PRN
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
## 2019-11-19 NOTE — Discharge Instructions (Addendum)
No fractures on Xray Use anti-inflammatories for pain/swelling. You may take up to 800 mg Ibuprofen every 8 hours with food. You may supplement Ibuprofen with Tylenol [phone] mg every 8 hours. You may use flexeril as needed to help with pain. This is a muscle relaxer and causes sedation- please use only at bedtime or when you will be home and not have to drive/work Ice and elevate foot/heel  Follow up with sports medicine if heel no improving over the next 1-2 weeks
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
## 2020-01-05 ENCOUNTER — Other Ambulatory Visit: Payer: Self-pay

## 2020-01-05 ENCOUNTER — Ambulatory Visit: Payer: Self-pay | Admitting: Family Medicine

## 2020-01-05 ENCOUNTER — Ambulatory Visit (INDEPENDENT_AMBULATORY_CARE_PROVIDER_SITE_OTHER): Payer: Self-pay | Admitting: Family Medicine

## 2020-01-05 ENCOUNTER — Ambulatory Visit: Payer: Self-pay

## 2020-01-05 VITALS — BP 152/82 | Ht 68.5 in | Wt 177.0 lb

## 2020-01-05 DIAGNOSIS — M7660 Achilles tendinitis, unspecified leg: Secondary | ICD-10-CM

## 2020-01-05 DIAGNOSIS — S161XXA Strain of muscle, fascia and tendon at neck level, initial encounter: Secondary | ICD-10-CM

## 2020-01-05 DIAGNOSIS — M7662 Achilles tendinitis, left leg: Secondary | ICD-10-CM | POA: Insufficient documentation

## 2020-01-05 NOTE — Patient Instructions (Signed)
It was great to see you!  Our plans for today:  - Wear the walking boot when walking/standing. - Do the exercises for your neck and upper back daily as prescribed. - Continue to take ibuprofen and muscle relaxer for pain. - Also try a heating pad for your neck pain. - Come back in 2 weeks for follow up.

## 2020-01-05 NOTE — Assessment & Plan Note (Addendum)
History and exam consistent with cervical muscle strain.  Prior x-rays reviewed without evidence of acute bony abnormality though with known degenerative changes.  Recommended continued NSAID and muscle relaxer use.  Will provide rhomboid and upper thoracic rehab exercises.  Encourage patient to obtain heating pad for additional relief.  Follow-up in 2 weeks.
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
## 2020-01-05 NOTE — Assessment & Plan Note (Addendum)
Appreciable on ultrasound and pain with dorsiflexion with limited AROM suggesting Achilles tendinitis.  Will have patient wear walking boot.  Continue NSAIDs for pain relief.  Follow-up in 2 weeks for recheck.

## 2020-01-05 NOTE — Progress Notes (Signed)
[facility] Attending Note: I have seen and examined this patient. I have discussed this patient with the resident and reviewed the assessment and plan as documented above. I agree with the resident's findings and plan.  

## 2020-01-19 ENCOUNTER — Ambulatory Visit (INDEPENDENT_AMBULATORY_CARE_PROVIDER_SITE_OTHER): Payer: Self-pay | Admitting: Family Medicine

## 2020-01-19 ENCOUNTER — Other Ambulatory Visit: Payer: Self-pay

## 2020-01-19 VITALS — BP 172/78 | Ht 68.5 in | Wt 177.0 lb

## 2020-01-19 DIAGNOSIS — M7662 Achilles tendinitis, left leg: Secondary | ICD-10-CM

## 2020-01-19 DIAGNOSIS — S161XXA Strain of muscle, fascia and tendon at neck level, initial encounter: Secondary | ICD-10-CM

## 2020-01-19 MED ORDER — NITROGLYCERIN 0.1 MG/HR TD PT24: 0.1000 mg | MEDICATED_PATCH | Freq: Every day | TRANSDERMAL | 1 refills | Status: DC

## 2020-01-19 MED ORDER — NITROGLYCERIN 0.2 MG/HR TD PT24: 0.2000 mg | MEDICATED_PATCH | Freq: Every day | TRANSDERMAL | 1 refills | Status: DC

## 2020-01-19 NOTE — Progress Notes (Signed)
[facility]: Attending Note: I have reviewed the chart, discussed wit the Sports Medicine Fellow. I agree with assessment and treatment plan as detailed in the Fellow's note.  
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
## 2020-01-19 NOTE — Patient Instructions (Addendum)
It was great to see you today! Continue the treatment as discussed. See [place] back in 2 weeks to follow up.  Nitroglycerin Protocol   Apply 1/4 nitroglycerin patch to affected area daily.  Change position of patch within the affected area every 24 hours.  You may experience a headache during the first 1-2 weeks of using the patch, these should subside.  If you experience headaches after beginning nitroglycerin patch treatment, you may take your preferred over the counter pain reliever.  Another side effect of the nitroglycerin patch is skin irritation or rash related to patch adhesive.  Please notify our office if you develop more severe headaches or rash, and stop the patch.  Tendon healing with nitroglycerin patch may require 12 to 24 weeks depending on the extent of injury.  Men should not use if taking Viagra, Cialis, or Levitra.   Do not use if you have migraines or rosacea.

## 2020-02-16 ENCOUNTER — Ambulatory Visit: Payer: Self-pay | Admitting: Family Medicine

## 2020-03-01 ENCOUNTER — Ambulatory Visit (INDEPENDENT_AMBULATORY_CARE_PROVIDER_SITE_OTHER): Payer: Self-pay | Admitting: Family Medicine

## 2020-03-01 ENCOUNTER — Other Ambulatory Visit: Payer: Self-pay

## 2020-03-01 VITALS — BP 132/92 | Ht 68.5 in | Wt 177.0 lb

## 2020-03-01 DIAGNOSIS — M7662 Achilles tendinitis, left leg: Secondary | ICD-10-CM

## 2020-03-01 NOTE — Progress Notes (Signed)
  Nathan Duran - 59 y.o. male MRN [id number]  Date of birth: 01-Feb-1961  SUBJECTIVE:   CC: achilles, neck pain follow up   59 year old man presenting for follow-up of Achilles tendinitis. He was last seen in clinic 6 weeks ago. Reports that pain has continued despite him wearing a CAM walking boot. He has been using a full nitroglycerin patch at a time.   ROS: No swelling, instability, muscle pain, numbness/tingling, redness, otherwise see HPI   PMHx - Updated and reviewed.  Contributory factors include: Negative PSHx - Updated and reviewed.  Contributory factors include:  Negative FHx - Updated and reviewed.  Contributory factors include:  Negative Social Hx - Updated and reviewed. Contributory factors include: Negative Medications - reviewed   DATA REVIEWED: Prior records  PHYSICAL EXAM:  VS: BP:(!) 132/92  HR: bpm  TEMP: ( )  RESP:   HT:5' 8.5" (174 cm)   WT:177 lb (80.3 kg)  BMI:26.52 PHYSICAL EXAM: Gen: NAD, alert, cooperative with exam, well-appearing HEENT: clear conjunctiva,  CV:  no edema, capillary refill brisk, normal rate Resp: non-labored Skin: no rashes, normal turgor  Neuro: no gross deficits.  Psych:  alert and oriented  Left Ankle: - Inspection: No obvious deformity, erythema, swelling, or ecchymosis - Palpation: TTP with calcaneal squeeze and over achilles insertion on calcaneus - Strength: Normal strength with dorsiflexion, plantarflexion, inversion, and eversion of foot; flexion and extension of toes. Pain with plantarfelxion. - ROM: Full ROM - Neuro/vasc: NV intact - Special Tests:  Able to do single leg heel raise but painful. aise.   Limited US of left achilles: Thickened achilles at insertion (0.96 cm) but tissue seems more organized with less tearing than previously. Neovascularization noted.   Impression: Achilles tendoninopathy  ASSESSMENT & PLAN:  59 year old man presenting for follow-up of Achilles tendinopathy. He has persistent  pain despite reports of wearing the boot with heel lift consistently. Ultrasound findings show improvement in tissue organization with continued hypertrophy and improved blood flow to tendon (currently wearing a full 0.2 mg nitroglycerin patch). Will start easy rehab with floor level heel lifts. Will write him back to work at part time progressing to full time. Return in 4-6 weeks.

## 2020-03-02 NOTE — Progress Notes (Signed)
[facility]: Attending Note: I have reviewed the chart, discussed wit the Sports Medicine Fellow. I agree with assessment and treatment plan as detailed in the Fellow's note. Wean off NTG patch as he has been using an entire patch. Gradual progressive rehab program.

## 2020-03-06 ENCOUNTER — Telehealth: Payer: Self-pay

## 2020-03-07 NOTE — Telephone Encounter (Signed)
Megan Dr. Robby Sermon is managing this patient with me so please let her know of call. She can help him decide about the boot. It is highly unlikely we will give hmi any additional pain medicine but she may want to start hm on gabapentin. THANKS! Denny Levy

## 2020-03-13 NOTE — Telephone Encounter (Signed)
Megan,  I am happy to prescribe him Gabapentin if he would like to try that. We could also try a meloxicam as well. I will call him this week to discuss.  Thanks, Rayfield Citizen

## 2020-03-15 MED ORDER — GABAPENTIN 300 MG PO CAPS: 300.0000 mg | ORAL_CAPSULE | Freq: Every day | ORAL | 1 refills | Status: DC

## 2020-03-15 MED ORDER — MELOXICAM 15 MG PO TABS: 15.0000 mg | ORAL_TABLET | Freq: Every day | ORAL | 1 refills | Status: DC

## 2020-03-15 NOTE — Addendum Note (Signed)
Addended by: Rutha Bouchard E on: 03/15/2020 09:39 AM   Modules accepted: Orders

## 2020-04-08 ENCOUNTER — Ambulatory Visit: Payer: Self-pay | Admitting: Physician Assistant

## 2020-04-08 ENCOUNTER — Other Ambulatory Visit: Payer: Self-pay

## 2020-04-08 VITALS — BP 144/96 | HR 102 | Temp 98.7°F | Resp 18 | Ht 68.5 in | Wt 175.0 lb

## 2020-04-08 DIAGNOSIS — E1165 Type 2 diabetes mellitus with hyperglycemia: Secondary | ICD-10-CM

## 2020-04-08 DIAGNOSIS — Z598 Other problems related to housing and economic circumstances: Secondary | ICD-10-CM

## 2020-04-08 DIAGNOSIS — Z125 Encounter for screening for malignant neoplasm of prostate: Secondary | ICD-10-CM

## 2020-04-08 DIAGNOSIS — Z114 Encounter for screening for human immunodeficiency virus [HIV]: Secondary | ICD-10-CM

## 2020-04-08 DIAGNOSIS — F1010 Alcohol abuse, uncomplicated: Secondary | ICD-10-CM

## 2020-04-08 DIAGNOSIS — I10 Essential (primary) hypertension: Secondary | ICD-10-CM

## 2020-04-08 DIAGNOSIS — Z5989 Other problems related to housing and economic circumstances: Secondary | ICD-10-CM

## 2020-04-08 DIAGNOSIS — Z13220 Encounter for screening for lipoid disorders: Secondary | ICD-10-CM

## 2020-04-08 DIAGNOSIS — G4700 Insomnia, unspecified: Secondary | ICD-10-CM

## 2020-04-08 DIAGNOSIS — Z1159 Encounter for screening for other viral diseases: Secondary | ICD-10-CM

## 2020-04-08 MED ORDER — LISINOPRIL-HYDROCHLOROTHIAZIDE 20-12.5 MG PO TABS: 1.0000 | ORAL_TABLET | Freq: Every day | ORAL | 0 refills | Status: DC

## 2020-04-08 MED ORDER — METFORMIN HCL 1000 MG PO TABS: 1000.0000 mg | ORAL_TABLET | Freq: Two times a day (BID) | ORAL | 0 refills | Status: DC

## 2020-04-08 NOTE — Patient Instructions (Signed)
I encourage you to take the Metformin twice a day, start with half a tablet twice a day for 1 week then increase to 1 full tablet twice a day.  I encourage you to resume your blood pressure medication.  I encourage you to work on reducing your alcohol intake and increasing your water consumption.  I encourage you to try melatonin over-the-counter to help you improve your sleep quality.  Please return to the mobile unit for fasting labs, we will also do a urine sample at that time to check kidneys.  Please let me know if there is anything else we can do for you  Roney Jaffe, PA-C Physician Assistant [facility] Medicine [email or web address]    Diabetes Mellitus and Nutrition, Adult When you have diabetes (diabetes mellitus), it is very important to have healthy eating habits because your blood sugar (glucose) levels are greatly affected by what you eat and drink. Eating healthy foods in the appropriate amounts, at about the same times every day, can help you:  Control your blood glucose.  Lower your risk of heart disease.  Improve your blood pressure.  Reach or maintain a healthy weight. Every person with diabetes is different, and each person has different needs for a meal plan. Your health care provider may recommend that you work with a diet and nutrition specialist (dietitian) to make a meal plan that is best for you. Your meal plan may vary depending on factors such as:  The calories you need.  The medicines you take.  Your weight.  Your blood glucose, blood pressure, and cholesterol levels.  Your activity level.  Other health conditions you have, such as heart or kidney disease. How do carbohydrates affect me? Carbohydrates, also called carbs, affect your blood glucose level more than any other type of food. Eating carbs naturally raises the amount of glucose in your blood. Carb counting is a method for keeping track of how  many carbs you eat. Counting carbs is important to keep your blood glucose at a healthy level, especially if you use insulin or take certain oral diabetes medicines. It is important to know how many carbs you can safely have in each meal. This is different for every person. Your dietitian can help you calculate how many carbs you should have at each meal and for each snack. Foods that contain carbs include:  Bread, cereal, rice, pasta, and crackers.  Potatoes and corn.  Peas, beans, and lentils.  Milk and yogurt.  Fruit and juice.  Desserts, such as cakes, cookies, ice cream, and candy. How does alcohol affect me? Alcohol can cause a sudden decrease in blood glucose (hypoglycemia), especially if you use insulin or take certain oral diabetes medicines. Hypoglycemia can be a life-threatening condition. Symptoms of hypoglycemia (sleepiness, dizziness, and confusion) are similar to symptoms of having too much alcohol. If your health care provider says that alcohol is safe for you, follow these guidelines:  Limit alcohol intake to no more than 1 drink per day for nonpregnant women and 2 drinks per day for men. One drink equals 12 oz of beer, 5 oz of wine, or 1 oz of hard liquor.  Do not drink on an empty stomach.  Keep yourself hydrated with water, diet soda, or unsweetened iced tea.  Keep in mind that regular soda, juice, and other mixers may contain a lot of sugar and must be counted as carbs. What are tips for following this plan?  Reading food labels  Start by checking  the serving size on the "Nutrition Facts" label of packaged foods and drinks. The amount of calories, carbs, fats, and other nutrients listed on the label is based on one serving of the item. Many items contain more than one serving per package.  Check the total grams (g) of carbs in one serving. You can calculate the number of servings of carbs in one serving by dividing the total carbs by 15. For example, if a food  has 30 g of total carbs, it would be equal to 2 servings of carbs.  Check the number of grams (g) of saturated and trans fats in one serving. Choose foods that have low or no amount of these fats.  Check the number of milligrams (mg) of salt (sodium) in one serving. Most people should limit total sodium intake to less than 2,300 mg per day.  Always check the nutrition information of foods labeled as "low-fat" or "nonfat". These foods may be higher in added sugar or refined carbs and should be avoided.  Talk to your dietitian to identify your daily goals for nutrients listed on the label. Shopping  Avoid buying canned, premade, or processed foods. These foods tend to be high in fat, sodium, and added sugar.  Shop around the outside edge of the grocery store. This includes fresh fruits and vegetables, bulk grains, fresh meats, and fresh dairy. Cooking  Use low-heat cooking methods, such as baking, instead of high-heat cooking methods like deep frying.  Cook using healthy oils, such as olive, canola, or sunflower oil.  Avoid cooking with butter, cream, or high-fat meats. Meal planning  Eat meals and snacks regularly, preferably at the same times every day. Avoid going long periods of time without eating.  Eat foods high in fiber, such as fresh fruits, vegetables, beans, and whole grains. Talk to your dietitian about how many servings of carbs you can eat at each meal.  Eat 4-6 ounces (oz) of lean protein each day, such as lean meat, chicken, fish, eggs, or tofu. One oz of lean protein is equal to: ? 1 oz of meat, chicken, or fish. ? 1 egg. ?  cup of tofu.  Eat some foods each day that contain healthy fats, such as avocado, nuts, seeds, and fish. Lifestyle  Check your blood glucose regularly.  Exercise regularly as told by your health care provider. This may include: ? 150 minutes of moderate-intensity or vigorous-intensity exercise each week. This could be brisk walking, biking,  or water aerobics. ? Stretching and doing strength exercises, such as yoga or weightlifting, at least 2 times a week.  Take medicines as told by your health care provider.  Do not use any products that contain nicotine or tobacco, such as cigarettes and e-cigarettes. If you need help quitting, ask your health care provider.  Work with a Social worker or diabetes educator to identify strategies to manage stress and any emotional and social challenges. Questions to ask a health care provider  Do I need to meet with a diabetes educator?  Do I need to meet with a dietitian?  What number can I call if I have questions?  When are the best times to check my blood glucose? Where to find more information:  American Diabetes Association: diabetes.org  Academy of Nutrition and Dietetics: www.eatright.[employer] of Diabetes and Digestive and Kidney Diseases (NIH): [employer] Summary  A healthy meal plan will help you control your blood glucose and maintain a healthy lifestyle.  Working with a diet and  nutrition specialist (dietitian) can help you make a meal plan that is best for you.  Keep in mind that carbohydrates (carbs) and alcohol have immediate effects on your blood glucose levels. It is important to count carbs and to use alcohol carefully. This information is not intended to replace advice given to you by your health care provider. Make sure you discuss any questions you have with your health care provider. Document Revised: 09/10/2017 Document Reviewed: 11/02/2016 Elsevier Patient Education  2020 [employer].

## 2020-04-08 NOTE — Progress Notes (Signed)
New Patient Office Visit  Subjective:  Patient ID: Nathan Duran, male    DOB: Mar 05, 1961  Age: 59 y.o. MRN: [id number]  CC:  Chief Complaint  Patient presents with  . Hypertension    HPI Nathan Duran reports that he has previously been treated for hypertension and diabetes. Has not had a primary care provider in the past year.  Endorses that he has not been consistent taking Metformin, states he checks his blood glucose occasionally with readings approximately 1 40-1 50. Does not follow a diabetic diet, endorses he drinks approximately 4 cans of beer a day during the week, will drink more on the weekends.  Reports he has been out of blood pressure medication, does not check blood pressure at home. States his blood pressure was well controlled previously with lisinopril 20 mg, hydrochlorothiazide 12.5 mg.  Reports recent injury stemming from an incident at work, ruptured Achilles tendon, is being followed by orthopedics. Reports that he did not start the gabapentin or Flexeril due to financial constraints.  Reports that he has difficulty falling asleep and staying asleep, states this has been going on for many years, states he is sleeping approximately 4 to 6 hours a night, states that he does not feel rested upon waking       Past Medical History:  Diagnosis Date  . Depression   . Diabetes mellitus   . Hypertension     Past Surgical History:  Procedure Laterality Date  . BACK SURGERY      Family History  Problem Relation Age of Onset  . Hypertension Mother   . Diabetes Mother   . Diabetes Father   . Hypertension Father     Social History   Socioeconomic History  . Marital status: Single    Spouse name: Not on file  . Number of children: Not on file  . Years of education: Not on file  . Highest education level: Not on file  Occupational History  . Not on file  Tobacco Use  . Smoking status: Never Smoker  . Smokeless tobacco: Never Used   Substance and Sexual Activity  . Alcohol use: Yes    Comment: 2-3 cans beer/night  . Drug use: No  . Sexual activity: Not Currently  Other Topics Concern  . Not on file  Social History Narrative  . Not on file   Social Determinants of Health   Financial Resource Strain:   . Difficulty of Paying Living Expenses:   Food Insecurity:   . Worried About Programme researcher, broadcasting/film/video in the Last Year:   . Barista in the Last Year:   Transportation Needs:   . Freight forwarder (Medical):   Marland Kitchen Lack of Transportation (Non-Medical):   Physical Activity:   . Days of Exercise per Week:   . Minutes of Exercise per Session:   Stress:   . Feeling of Stress :   Social Connections:   . Frequency of Communication with Friends and Family:   . Frequency of Social Gatherings with Friends and Family:   . Attends Religious Services:   . Active Member of Clubs or Organizations:   . Attends Banker Meetings:   Marland Kitchen Marital Status:   Intimate Partner Violence:   . Fear of Current or Ex-Partner:   . Emotionally Abused:   Marland Kitchen Physically Abused:   . Sexually Abused:     ROS Review of Systems  Constitutional: Negative.   HENT: Negative.   Eyes:  Negative.  Negative for visual disturbance.  Respiratory: Negative.   Cardiovascular: Negative.   Gastrointestinal: Negative.   Endocrine: Negative.   Genitourinary: Negative.   Musculoskeletal: Positive for gait problem.  Skin: Negative.   Allergic/Immunologic: Negative.   Neurological: Negative for dizziness, light-headedness and headaches.  Hematological: Negative.   Psychiatric/Behavioral: Negative.     Objective:   Today's Vitals: BP (!) 144/96 (BP Location: Left Arm, Patient Position: Sitting, Cuff Size: Normal)   Pulse (!) 102   Temp 98.7 F (37.1 C) (Oral)   Resp 18   Ht 5' 8.5" (1.74 m)   Wt 175 lb (79.4 kg)   BMI 26.22 kg/m   Physical Exam Constitutional:      General: He is not in acute distress.    Appearance:  Normal appearance. He is not ill-appearing.  HENT:     Head: Normocephalic and atraumatic.     Right Ear: External ear normal.     Left Ear: External ear normal.     Nose: Nose normal.     Mouth/Throat:     Mouth: Mucous membranes are moist.     Pharynx: Oropharynx is clear.  Eyes:     General: Scleral icterus present.     Extraocular Movements: Extraocular movements intact.     Pupils: Pupils are equal, round, and reactive to light.  Cardiovascular:     Rate and Rhythm: Normal rate and regular rhythm.     Pulses: Normal pulses.     Heart sounds: Normal heart sounds.  Pulmonary:     Effort: Pulmonary effort is normal.     Breath sounds: Normal breath sounds.  Abdominal:     General: Abdomen is flat. Bowel sounds are normal.     Palpations: Abdomen is soft.  Musculoskeletal:     Cervical back: Normal range of motion and neck supple.  Feet:     Comments: Patient in left walking boot Skin:    General: Skin is warm and dry.  Neurological:     General: No focal deficit present.     Mental Status: He is alert and oriented to person, place, and time.  Psychiatric:        Mood and Affect: Mood normal.        Behavior: Behavior normal.        Thought Content: Thought content normal.        Judgment: Judgment normal.     Assessment & Plan:   Problem List Items Addressed This Visit      Cardiovascular and Mediastinum   Essential hypertension   Relevant Medications   lisinopril-hydrochlorothiazide (ZESTORETIC) 20-12.5 MG tablet   Other Relevant Orders   TSH     Endocrine   Uncontrolled type 2 diabetes mellitus with hyperglycemia (HCC) - Primary   Relevant Medications   lisinopril-hydrochlorothiazide (ZESTORETIC) 20-12.5 MG tablet   metFORMIN (GLUCOPHAGE) 1000 MG tablet   Other Relevant Orders   CBC with Differential/Platelet   Comp. Metabolic Panel (12)   TSH   Vitamin D, 25-hydroxy   Microalbumin / creatinine urine ratio     Other   Alcohol abuse    Other Visit  Diagnoses    Uninsured       Screening for malignant neoplasm of prostate       Relevant Orders   PSA   Screening, lipid       Relevant Orders   Lipid panel   Encounter for HCV screening test for low risk patient  Relevant Orders   Hepatitis c antibody (reflex)   Screening for HIV without presence of risk factors       Relevant Orders   HIV antibody (with reflex)   Insomnia, unspecified type          Outpatient Encounter Medications as of 04/08/2020  Medication Sig  . cyclobenzaprine (FLEXERIL) 5 MG tablet Take 1-2 tablets (5-10 mg total) by mouth 2 (two) times daily as needed for muscle spasms.  Marland Kitchen gabapentin (NEURONTIN) 300 MG capsule Take 1 capsule (300 mg total) by mouth at bedtime.  Marland Kitchen ibuprofen (ADVIL) 800 MG tablet Take 1 tablet (800 mg total) by mouth 3 (three) times daily.  Marland Kitchen lisinopril-hydrochlorothiazide (ZESTORETIC) 20-12.5 MG tablet Take 1 tablet by mouth daily.  . meloxicam (MOBIC) 15 MG tablet Take 1 tablet (15 mg total) by mouth daily.  . [DISCONTINUED] lisinopril-hydrochlorothiazide (ZESTORETIC) 20-12.5 MG tablet Take 1 tablet by mouth daily.  . [DISCONTINUED] nitroGLYCERIN (NITRODUR - DOSED IN MG/24 HR) 0.2 mg/hr patch Place 1 patch (0.2 mg total) onto the skin daily.  . metFORMIN (GLUCOPHAGE) 1000 MG tablet Take 1 tablet (1,000 mg total) by mouth 2 (two) times daily with a meal.  . [DISCONTINUED] ipratropium (ATROVENT) 0.06 % nasal spray Place 2 sprays into both nostrils 4 (four) times daily.  . [DISCONTINUED] metFORMIN (GLUCOPHAGE) 1000 MG tablet Take 1 tablet (1,000 mg total) by mouth 2 (two) times daily with a meal.   No facility-administered encounter medications on file as of 04/08/2020.  1. Uncontrolled type 2 diabetes mellitus with hyperglycemia (HCC) Resume Metformin, take 500 mg twice a day for 1 week, then increase to 1000 mg twice a day.  Gave patient education on improving overall diet, compliance - metFORMIN (GLUCOPHAGE) 1000 MG tablet; Take 1 tablet  (1,000 mg total) by mouth 2 (two) times daily with a meal.  Dispense: 120 tablet; Refill: 0 - CBC with Differential/Platelet; Future - Comp. Metabolic Panel (12); Future - TSH; Future - Vitamin D, 25-hydroxy; Future - Microalbumin / creatinine urine ratio; Future  2. Essential hypertension Resume previous medication, encouraged reduction of alcohol consumption, increased hydration - lisinopril-hydrochlorothiazide (ZESTORETIC) 20-12.5 MG tablet; Take 1 tablet by mouth daily.  Dispense: 60 tablet; Refill: 0 - TSH; Future  3. Alcohol abuse Strongly encouraged reduction of alcohol consumption  4. Uninsured Patient given appointment for financial assistance as well as [facility] Delford Field on August 3  5. Screening for malignant neoplasm of prostate  - PSA; Future  6. Screening, lipid  - Lipid panel; Future  7. Encounter for HCV screening test for low risk patient  - Hepatitis c antibody (reflex); Future  8. Screening for HIV without presence of risk factors  - HIV antibody (with reflex); Future  9. Insomnia Encouraged trial melatonin over-the-counter, consider sleep study if no improvement  I have reviewed the patient's medical history (PMH, PSH, Social History, Family History, Medications, and allergies) , and have been updated if relevant. I spent 45 minutes reviewing chart and  face to face time with patient.     Follow-up: Return in about 1 day (around 04/09/2020) for Fasting  labs.   Kasandra Knudsen Mayers, PA-C

## 2020-04-09 ENCOUNTER — Other Ambulatory Visit: Payer: Self-pay

## 2020-04-10 ENCOUNTER — Telehealth: Payer: Self-pay | Admitting: *Deleted

## 2020-04-10 LAB — CBC WITH DIFFERENTIAL/PLATELET
Basophils Absolute: 0 10*3/uL (ref 0.0–0.2)
Basos: 1 %
EOS (ABSOLUTE): 0.1 10*3/uL (ref 0.0–0.4)
Eos: 2 %
Hematocrit: 43.4 % (ref 37.5–51.0)
Hemoglobin: 14.4 g/dL (ref 13.0–17.7)
Immature Grans (Abs): 0 10*3/uL (ref 0.0–0.1)
Immature Granulocytes: 0 %
Lymphocytes Absolute: 1.1 10*3/uL (ref 0.7–3.1)
Lymphs: 24 %
MCH: 30.9 pg (ref 26.6–33.0)
MCHC: 33.2 g/dL (ref 31.5–35.7)
MCV: 93 fL (ref 79–97)
Monocytes Absolute: 0.6 10*3/uL (ref 0.1–0.9)
Monocytes: 14 %
Neutrophils Absolute: 2.7 10*3/uL (ref 1.4–7.0)
Neutrophils: 59 %
Platelets: 243 10*3/uL (ref 150–450)
RBC: 4.66 x10E6/uL (ref 4.14–5.80)
RDW: 13.1 % (ref 11.6–15.4)
WBC: 4.5 10*3/uL (ref 3.4–10.8)

## 2020-04-10 LAB — COMP. METABOLIC PANEL (12)
AST: 16 IU/L (ref 0–40)
Albumin/Globulin Ratio: 1.4 (ref 1.2–2.2)
Albumin: 4.3 g/dL (ref 3.8–4.9)
Alkaline Phosphatase: 82 IU/L (ref 48–121)
BUN/Creatinine Ratio: 16 (ref 9–20)
BUN: 15 mg/dL (ref 6–24)
Bilirubin Total: 0.9 mg/dL (ref 0.0–1.2)
Calcium: 9.9 mg/dL (ref 8.7–10.2)
Chloride: 99 mmol/L (ref 96–106)
Creatinine, Ser: 0.95 mg/dL (ref 0.76–1.27)
GFR calc Af Amer: 102 mL/min/{1.73_m2} (ref >59)
GFR calc non Af Amer: 88 mL/min/{1.73_m2} (ref >59)
Globulin, Total: 3 g/dL (ref 1.5–4.5)
Glucose: 282 mg/dL — ABNORMAL HIGH (ref 65–99)
Potassium: 4 mmol/L (ref 3.5–5.2)
Sodium: 139 mmol/L (ref 134–144)
Total Protein: 7.3 g/dL (ref 6.0–8.5)

## 2020-04-10 LAB — LIPID PANEL
Chol/HDL Ratio: 3.7 ratio (ref 0.0–5.0)
Cholesterol, Total: 263 mg/dL — ABNORMAL HIGH (ref 100–199)
HDL: 72 mg/dL (ref >39)
LDL Chol Calc (NIH): 170 mg/dL — ABNORMAL HIGH (ref 0–99)
Triglycerides: 118 mg/dL (ref 0–149)
VLDL Cholesterol Cal: 21 mg/dL (ref 5–40)

## 2020-04-10 LAB — TSH: TSH: 1.03 u[IU]/mL (ref 0.450–4.500)

## 2020-04-10 LAB — HCV COMMENT:

## 2020-04-10 LAB — VITAMIN D 25 HYDROXY (VIT D DEFICIENCY, FRACTURES): Vit D, 25-Hydroxy: 19 ng/mL — ABNORMAL LOW (ref 30.0–100.0)

## 2020-04-10 LAB — MICROALBUMIN / CREATININE URINE RATIO
Creatinine, Urine: 115.9 mg/dL
Microalb/Creat Ratio: 120 mg/g creat — ABNORMAL HIGH (ref 0–29)
Microalbumin, Urine: 139.5 ug/mL

## 2020-04-10 LAB — HIV ANTIBODY (ROUTINE TESTING W REFLEX): HIV Screen 4th Generation wRfx: NONREACTIVE

## 2020-04-10 LAB — PSA: Prostate Specific Ag, Serum: 0.3 ng/mL (ref 0.0–4.0)

## 2020-04-10 LAB — HEPATITIS C ANTIBODY (REFLEX): HCV Ab: <0.1 s/co ratio (ref 0.0–0.9)

## 2020-04-10 NOTE — Telephone Encounter (Signed)
-----   Message from Roney Jaffe, [place] sent at 04/10/2020 10:02 AM EDT ----- Please call patient and ask him to return to the mobile unit at the beginning of the week so we can discuss his lab results

## 2020-04-10 NOTE — Telephone Encounter (Signed)
MA unable to reach patient and no VM option. Patient needs to return to [facility] next week to discuss resuts

## 2020-04-11 ENCOUNTER — Telehealth: Payer: Self-pay

## 2020-04-12 ENCOUNTER — Other Ambulatory Visit: Payer: Self-pay

## 2020-04-12 ENCOUNTER — Ambulatory Visit (INDEPENDENT_AMBULATORY_CARE_PROVIDER_SITE_OTHER): Payer: Self-pay | Admitting: Family Medicine

## 2020-04-12 ENCOUNTER — Encounter: Payer: Self-pay | Admitting: Family Medicine

## 2020-04-12 DIAGNOSIS — M7662 Achilles tendinitis, left leg: Secondary | ICD-10-CM

## 2020-04-12 NOTE — Patient Instructions (Signed)
Let us retry the patch with the smallest piece that you can effectively use.  I have given him a letter for his lawyer.  I would like to see him back in 4 weeks.  Continue to wear the boot if he is doing any significant walking or standing.

## 2020-04-13 NOTE — Assessment & Plan Note (Signed)
No significant improvement since last office visit. I think he really needs to get his blood sugar under control if he wants to heal the achilles issues. Also he has not been as consistent with NTG patch Will continue bot, patch, urged to get working  On blood sugar control, f/u 4 weeks

## 2020-04-13 NOTE — Progress Notes (Signed)
  Nathan Duran - 59 y.o. male MRN [id number]  Date of birth: [birth year]    SUBJECTIVE:      Chief Complaint:/ HPI:   f/u left achilles tendinitis. He has not had any significant improvement since last time. Still has to wear the boot if he wants to do any walking or standing. Has some rest pain.He is frustrated.  Recenlty saw new PCP and had A1c that was elevated and has not been on his meds for DM as regularly and also not following good diet (financial concerns)    OBJECTIVE: BP (!) 142/72   Ht 5' 8.5" (1.74 m)   Wt 175 lb (79.4 kg)   BMI 26.22 kg/m   Physical Exam:  Vital signs are reviewed. General WD WN NAD Left Ankle: - Inspection: No obvious deformity, erythema, swelling, or ecchymosis - Palpation: moderately TTP with calcaneal squeeze and over achilles insertion on calcaneus - Strength: Normal strength with dorsiflexion, plantarflexion, inversion, and eversion of foot; flexion and extension of toes. Pain in achilles area with plantar felxion. - ROM: Full ROM   ASSESSMENT & PLAN:  See problem based charting & AVS for pt instructions. Achilles tendinitis, left leg No significant improvement since last office visit. I think he really needs to get his blood sugar under control if he wants to heal the achilles issues. Also he has not been as consistent with NTG patch Will continue bot, patch, urged to get working  On blood sugar control, f/u 4 weeks

## 2020-04-16 ENCOUNTER — Ambulatory Visit: Payer: Self-pay | Admitting: Physician Assistant

## 2020-04-16 ENCOUNTER — Other Ambulatory Visit: Payer: Self-pay

## 2020-04-16 VITALS — BP 138/86 | HR 97 | Temp 98.2°F | Resp 18 | Ht 68.5 in

## 2020-04-16 DIAGNOSIS — E7849 Other hyperlipidemia: Secondary | ICD-10-CM

## 2020-04-16 DIAGNOSIS — E559 Vitamin D deficiency, unspecified: Secondary | ICD-10-CM

## 2020-04-16 DIAGNOSIS — R809 Proteinuria, unspecified: Secondary | ICD-10-CM

## 2020-04-16 DIAGNOSIS — Z713 Dietary counseling and surveillance: Secondary | ICD-10-CM

## 2020-04-16 MED ORDER — ATORVASTATIN CALCIUM 10 MG PO TABS
10.0000 mg | ORAL_TABLET | Freq: Every day | ORAL | 3 refills | Status: DC
Start: 2020-04-16 — End: 2021-07-04

## 2020-04-16 NOTE — Progress Notes (Signed)
Established Patient Office Visit  Subjective:  Patient ID: Nathan Duran, male    DOB: [birth year]  Age: 59 y.o. MRN: [id number]  CC:  Chief Complaint  Patient presents with  . Abnormal Lab    HPI Nathan Duran was seen in the mobile unit last week and had fasting labs completed.   States that he did restart his medications and has been working on his diet choices.  States that he does eat fried foods frequently.  No new concerns.  The 10-year ASCVD risk score Denman George DC Montez Hageman., et al., 2013) is: 23.7%   Values used to calculate the score:     Age: 47 years     Sex: Male     Is Non-Hispanic African American: Yes     Diabetic: Yes     Tobacco smoker: No     Systolic Blood Pressure: 138 mmHg     Is BP treated: Yes     HDL Cholesterol: 72 mg/dL     Total Cholesterol: 263 mg/dL  Past Medical History:  Diagnosis Date  . Depression   . Diabetes mellitus   . Hypertension     Past Surgical History:  Procedure Laterality Date  . BACK SURGERY      Family History  Problem Relation Age of Onset  . Hypertension Mother   . Diabetes Mother   . Diabetes Father   . Hypertension Father     Social History   Socioeconomic History  . Marital status: Single    Spouse name: Not on file  . Number of children: Not on file  . Years of education: Not on file  . Highest education level: Not on file  Occupational History  . Not on file  Tobacco Use  . Smoking status: Never Smoker  . Smokeless tobacco: Never Used  Substance and Sexual Activity  . Alcohol use: Yes    Comment: 2-3 cans beer/night  . Drug use: No  . Sexual activity: Not Currently  Other Topics Concern  . Not on file  Social History Narrative  . Not on file   Social Determinants of Health   Financial Resource Strain:   . Difficulty of Paying Living Expenses:   Food Insecurity:   . Worried About Programme researcher, broadcasting/film/video in the Last Year:   . Barista in the Last Year:   Transportation Needs:   .  Freight forwarder (Medical):   Marland Kitchen Lack of Transportation (Non-Medical):   Physical Activity:   . Days of Exercise per Week:   . Minutes of Exercise per Session:   Stress:   . Feeling of Stress :   Social Connections:   . Frequency of Communication with Friends and Family:   . Frequency of Social Gatherings with Friends and Family:   . Attends Religious Services:   . Active Member of Clubs or Organizations:   . Attends Banker Meetings:   Marland Kitchen Marital Status:   Intimate Partner Violence:   . Fear of Current or Ex-Partner:   . Emotionally Abused:   Marland Kitchen Physically Abused:   . Sexually Abused:     Outpatient Medications Prior to Visit  Medication Sig Dispense Refill  . cyclobenzaprine (FLEXERIL) 5 MG tablet Take 1-2 tablets (5-10 mg total) by mouth 2 (two) times daily as needed for muscle spasms. 24 tablet 0  . gabapentin (NEURONTIN) 300 MG capsule Take 1 capsule (300 mg total) by mouth at bedtime. 30 capsule 1  .  ibuprofen (ADVIL) 800 MG tablet Take 1 tablet (800 mg total) by mouth 3 (three) times daily. 21 tablet 0  . lisinopril-hydrochlorothiazide (ZESTORETIC) 20-12.5 MG tablet Take 1 tablet by mouth daily. 60 tablet 0  . meloxicam (MOBIC) 15 MG tablet Take 1 tablet (15 mg total) by mouth daily. 30 tablet 1  . metFORMIN (GLUCOPHAGE) 1000 MG tablet Take 1 tablet (1,000 mg total) by mouth 2 (two) times daily with a meal. 120 tablet 0   No facility-administered medications prior to visit.    No Known Allergies  ROS Review of Systems  Constitutional: Negative.   HENT: Negative.   Eyes: Negative.   Respiratory: Negative.   Cardiovascular: Negative.   Gastrointestinal: Negative.   Endocrine: Negative.   Genitourinary: Negative.   Musculoskeletal: Positive for gait problem.  Skin: Negative.   Allergic/Immunologic: Negative.   Hematological: Negative.   Psychiatric/Behavioral: Negative.       Objective:    Physical Exam Vitals and nursing note reviewed.   Constitutional:      Appearance: Normal appearance.  HENT:     Head: Normocephalic and atraumatic.     Right Ear: External ear normal.     Left Ear: External ear normal.     Nose: Nose normal.     Mouth/Throat:     Mouth: Mucous membranes are moist.     Pharynx: Oropharynx is clear.  Eyes:     Extraocular Movements: Extraocular movements intact.     Conjunctiva/sclera: Conjunctivae normal.     Pupils: Pupils are equal, round, and reactive to light.  Cardiovascular:     Rate and Rhythm: Normal rate and regular rhythm.     Pulses: Normal pulses.     Heart sounds: Normal heart sounds.  Pulmonary:     Effort: Pulmonary effort is normal.     Breath sounds: Normal breath sounds.  Abdominal:     General: Abdomen is flat. Bowel sounds are normal.     Palpations: Abdomen is soft.  Musculoskeletal:        General: Normal range of motion.     Cervical back: Normal range of motion and neck supple.  Feet:     Comments: Walking boot on left foot Skin:    General: Skin is warm and dry.  Neurological:     General: No focal deficit present.     Mental Status: He is alert and oriented to person, place, and time.  Psychiatric:        Mood and Affect: Mood normal.        Behavior: Behavior normal.        Thought Content: Thought content normal.        Judgment: Judgment normal.     BP 138/86 (BP Location: Left Arm, Patient Position: Sitting, Cuff Size: Normal)   Pulse 97   Temp 98.2 F (36.8 C) (Oral)   Resp 18   Ht 5' 8.5" (1.74 m)   SpO2 98%   BMI 26.22 kg/m  Wt Readings from Last 3 Encounters:  04/12/20 175 lb (79.4 kg)  04/08/20 175 lb (79.4 kg)  03/01/20 177 lb (80.3 kg)     Health Maintenance Due  Topic Date Due  . HEMOGLOBIN A1C  Never done  . PNEUMOCOCCAL POLYSACCHARIDE VACCINE AGE 4-64 HIGH RISK  Never done  . FOOT EXAM  Never done  . OPHTHALMOLOGY EXAM  Never done  . COVID-19 Vaccine (1) Never done  . COLONOSCOPY  Never done    There are no preventive care  reminders to display for this patient.  Lab Results  Component Value Date   TSH 1.030 04/09/2020   Lab Results  Component Value Date   WBC 4.5 04/09/2020   HGB 14.4 04/09/2020   HCT 43.4 04/09/2020   MCV 93 04/09/2020   PLT 243 04/09/2020   Lab Results  Component Value Date   NA 139 04/09/2020   K 4.0 04/09/2020   CO2 28 10/12/2019   GLUCOSE 282 (H) 04/09/2020   BUN 15 04/09/2020   CREATININE 0.95 04/09/2020   BILITOT 0.9 04/09/2020   ALKPHOS 82 04/09/2020   AST 16 04/09/2020   ALT 31 12/09/2015   PROT 7.3 04/09/2020   ALBUMIN 4.3 04/09/2020   CALCIUM 9.9 04/09/2020   ANIONGAP 9 10/12/2019   Lab Results  Component Value Date   CHOL 263 (H) 04/09/2020   Lab Results  Component Value Date   HDL 72 04/09/2020   Lab Results  Component Value Date   LDLCALC 170 (H) 04/09/2020   Lab Results  Component Value Date   TRIG 118 04/09/2020   Lab Results  Component Value Date   CHOLHDL 3.7 04/09/2020   No results found for: HGBA1C    Assessment & Plan:   Problem List Items Addressed This Visit    None    Visit Diagnoses    Other hyperlipidemia    -  Primary   Relevant Medications   atorvastatin (LIPITOR) 10 MG tablet   Vitamin D deficiency       Nutritional counseling       Microalbuminuria        1. Other hyperlipidemia Trial Lipitor 10 mg, encourage patient to continue working on lifestyle modifications.  2. Vitamin D deficiency Trial vitamin D 2000 units over-the-counter  3. Nutritional counseling   4. Microalbuminuria  Patient recently restarted lisinopril, encouraged to continue working on reducing blood glucose levels.   I have reviewed the patient's medical history (PMH, PSH, Social History, Family History, Medications, and allergies) , and have been updated if relevant. I spent 30 minutes reviewing chart and  face to face time with patient.    Meds ordered this encounter  Medications  . atorvastatin (LIPITOR) 10 MG tablet    Sig: Take 1  tablet (10 mg total) by mouth daily.    Dispense:  90 tablet    Refill:  3    Order Specific Question:   Supervising Provider    Answer:   Storm Frisk [[id number]]    Follow-up: Return if symptoms worsen or fail to improve.    Kasandra Knudsen Mayers, PA-C

## 2020-04-16 NOTE — Patient Instructions (Addendum)
For your cholesterol, you are going to take atorvastatin at bedtime.  I recommend that you work on incorporating foods from the Mediterranean diet into your every day meals.  Dr. Delford Field will recheck your cholesterol in 3 to 6 months.  Your vitamin D was low, I recommend that you take 2000 units over-the-counter once daily.    Please let us know if there is anything else we can do for you  Nathan Jaffe, PA-C Physician Assistant [facility] [email or web address]     Mediterranean Diet A Mediterranean diet refers to food and lifestyle choices that are based on the traditions of countries located on the [place] Sea. This way of eating has been shown to help prevent certain conditions and improve outcomes for people who have chronic diseases, like kidney disease and heart disease. What are tips for following this plan? Lifestyle  Cook and eat meals together with your family, when possible.  Drink enough fluid to keep your urine clear or pale yellow.  Be physically active every day. This includes: ? Aerobic exercise like running or swimming. ? Leisure activities like gardening, walking, or housework.  Get 7-8 hours of sleep each night.  If recommended by your health care provider, drink red wine in moderation. This means 1 glass a day for nonpregnant women and 2 glasses a day for men. A glass of wine equals 5 oz (150 mL). Reading food labels   Check the serving size of packaged foods. For foods such as rice and pasta, the serving size refers to the amount of cooked product, not dry.  Check the total fat in packaged foods. Avoid foods that have saturated fat or trans fats.  Check the ingredients list for added sugars, such as corn syrup. Shopping  At the grocery store, buy most of your food from the areas near the walls of the store. This includes: ? Fresh fruits and vegetables (produce). ? Grains, beans, nuts, and  seeds. Some of these may be available in unpackaged forms or large amounts (in bulk). ? Fresh seafood. ? Poultry and eggs. ? Low-fat dairy products.  Buy whole ingredients instead of prepackaged foods.  Buy fresh fruits and vegetables in-season from local farmers markets.  Buy frozen fruits and vegetables in resealable bags.  If you do not have access to quality fresh seafood, buy precooked frozen shrimp or canned fish, such as tuna, salmon, or sardines.  Buy small amounts of raw or cooked vegetables, salads, or olives from the deli or salad bar at your store.  Stock your pantry so you always have certain foods on hand, such as olive oil, canned tuna, canned tomatoes, rice, pasta, and beans. Cooking  Cook foods with extra-virgin olive oil instead of using butter or other vegetable oils.  Have meat as a side dish, and have vegetables or grains as your main dish. This means having meat in small portions or adding small amounts of meat to foods like pasta or stew.  Use beans or vegetables instead of meat in common dishes like chili or lasagna.  Experiment with different cooking methods. Try roasting or broiling vegetables instead of steaming or sauteing them.  Add frozen vegetables to soups, stews, pasta, or rice.  Add nuts or seeds for added healthy fat at each meal. You can add these to yogurt, salads, or vegetable dishes.  Marinate fish or vegetables using olive oil, lemon juice, garlic, and fresh herbs. Meal planning   Plan to eat 1 vegetarian meal one day  each week. Try to work up to 2 vegetarian meals, if possible.  Eat seafood 2 or more times a week.  Have healthy snacks readily available, such as: ? Vegetable sticks with hummus. ? [place] yogurt. ? Fruit and nut trail mix.  Eat balanced meals throughout the week. This includes: ? Fruit: 2-3 servings a day ? Vegetables: 4-5 servings a day ? Low-fat dairy: 2 servings a day ? Fish, poultry, or lean meat: 1 serving a  day ? Beans and legumes: 2 or more servings a week ? Nuts and seeds: 1-2 servings a day ? Whole grains: 6-8 servings a day ? Extra-virgin olive oil: 3-4 servings a day  Limit red meat and sweets to only a few servings a month What are my food choices?  Mediterranean diet ? Recommended  Grains: Whole-grain pasta. Brown rice. Bulgar wheat. Polenta. Couscous. Whole-wheat bread. Orpah Cobb.  Vegetables: Artichokes. Beets. Broccoli. Cabbage. Carrots. Eggplant. Green beans. Chard. Kale. Spinach. Onions. Leeks. Peas. Squash. Tomatoes. Peppers. Radishes.  Fruits: Apples. Apricots. Avocado. Berries. Bananas. Cherries. Dates. Figs. Grapes. Lemons. Melon. Oranges. Peaches. Plums. Pomegranate.  Meats and other protein foods: Beans. Almonds. Sunflower seeds. Pine nuts. Peanuts. Cod. Salmon. Scallops. Shrimp. Tuna. Tilapia. Clams. Oysters. Eggs.  Dairy: Low-fat milk. Cheese. Greek yogurt.  Beverages: Water. Red wine. Herbal tea.  Fats and oils: Extra virgin olive oil. Avocado oil. Grape seed oil.  Sweets and desserts: [place] yogurt with honey. Baked apples. Poached pears. Trail mix.  Seasoning and other foods: Basil. Cilantro. Coriander. Cumin. Mint. Parsley. Sage. Rosemary. Tarragon. Garlic. Oregano. Thyme. Pepper. Balsalmic vinegar. Tahini. Hummus. Tomato sauce. Olives. Mushrooms. ? Limit these  Grains: Prepackaged pasta or rice dishes. Prepackaged cereal with added sugar.  Vegetables: Deep fried potatoes (french fries).  Fruits: Fruit canned in syrup.  Meats and other protein foods: Beef. Pork. Lamb. Poultry with skin. Hot dogs. Tomasa Blase.  Dairy: Ice cream. Sour cream. Whole milk.  Beverages: Juice. Sugar-sweetened soft drinks. Beer. Liquor and spirits.  Fats and oils: Butter. Canola oil. Vegetable oil. Beef fat (tallow). Lard.  Sweets and desserts: Cookies. Cakes. Pies. Candy.  Seasoning and other foods: Mayonnaise. Premade sauces and marinades. The items listed may not be a  complete list. Talk with your dietitian about what dietary choices are right for you. Summary  The Mediterranean diet includes both food and lifestyle choices.  Eat a variety of fresh fruits and vegetables, beans, nuts, seeds, and whole grains.  Limit the amount of red meat and sweets that you eat.  Talk with your health care provider about whether it is safe for you to drink red wine in moderation. This means 1 glass a day for nonpregnant women and 2 glasses a day for men. A glass of wine equals 5 oz (150 mL). This information is not intended to replace advice given to you by your health care provider. Make sure you discuss any questions you have with your health care provider. Document Revised: 05/28/2016 Document Reviewed: 05/21/2016 Elsevier Patient Education  2020 [employer].

## 2020-04-17 ENCOUNTER — Telehealth: Payer: Self-pay

## 2020-04-17 NOTE — Telephone Encounter (Signed)
Staff spoke with patient about seeing the doctor in person for test results in the next few days and to collaborate on a healthy plan of action.

## 2020-04-17 NOTE — Telephone Encounter (Signed)
Pt called inquiring about test results 

## 2020-05-14 ENCOUNTER — Ambulatory Visit: Payer: Self-pay | Admitting: Critical Care Medicine

## 2020-05-17 ENCOUNTER — Ambulatory Visit (INDEPENDENT_AMBULATORY_CARE_PROVIDER_SITE_OTHER): Payer: Self-pay | Admitting: Family Medicine

## 2020-05-17 ENCOUNTER — Other Ambulatory Visit: Payer: Self-pay

## 2020-05-17 VITALS — BP 128/73 | Ht 68.5 in | Wt 175.0 lb

## 2020-05-17 DIAGNOSIS — M7662 Achilles tendinitis, left leg: Secondary | ICD-10-CM

## 2020-05-17 NOTE — Progress Notes (Signed)
    SUBJECTIVE:   CHIEF COMPLAINT / HPI:   Achilles tendinitis Mr. Husmann is a very pleasant 59 year old male here today for follow-up due to Achilles tendinitis of his left Achilles tendon.  He reports that he has been more consistent using the stretches that heel lifts and the nitro patches as they were prescribed and states that he has much improvement today.  He states his pain is more located and tender over the insertion of the Achilles tendon and at the very back of the heel.  However it is much improved from when he originally started coming in to see [place] for this problem.  He is out of the boot and agreed not to go back into it.  He states he feels good he can walk without pain he is still somewhat scared to run but he knows to increase his activity level as pain tolerates and to do so slowly.  He is excited and ready to go back to work.  PERTINENT  PMH / PSH: Hypertension, type 2 diabetes  OBJECTIVE:   BP 128/73   Ht 5' 8.5" (1.74 m)   Wt 175 lb (79.4 kg)   BMI 26.22 kg/m   MSK: Ankle/Foot, Left: No visible erythema, swelling, ecchymosis, or bony deformity. Range of motion is full in all directions. Strength is 5/5 in all directions. Tenderness at the insertion of the Achilles tendon; Unremarkable Thompson test. Able to walk 4 steps.    ASSESSMENT/PLAN:   Achilles tendinitis, left leg Instructed patient that since he is doing better he can now increase his activity as pain tolerates and he should do so slowly in increments.  However did discuss with him at this point he should not need to worry about reinjuring the tendon as he is doing much better.  Instructed patient to continue the stretching program and gave him heel lifts for his sneakers today.  Instructed patient to continue using nitro patches and to only use meloxicam as needed from here on out and to stop taking regularly.  He can follow-up with [place] as needed.     Arlyce Harman, DO PGY-4, Sports Medicine  [facility]

## 2020-05-17 NOTE — Patient Instructions (Signed)
It was great to meet you today! Thank you for letting me participate in your care!  Today, we discussed your left achilles pain and I am glad you have made such great improvement. You can use Meloxicam only as needed and make sure to take it with food. I would only take it if you are in pain with walking. You can continue using the nitro patches for the next 2 weeks but if you are able to walk and run without pain then stop the patches.  We got you some heel lifts today to use in your sneakers. This should help. You can slowly progress your activity and keep increasing activity as pain allows.  Follow up with [place] as needed. It was a pleasure taking care of you!  Be well, Jules Schick, DO PGY-4, Sports Medicine Fellow [facility]

## 2020-05-17 NOTE — Assessment & Plan Note (Addendum)
Instructed patient that since he is doing better he can now increase his activity as pain tolerates and he should do so slowly in increments.  However did discuss with him at this point he should not need to worry about reinjuring the tendon as he is doing much better.  Instructed patient to continue the stretching program and gave him heel lifts for his sneakers today.  Instructed patient to continue using nitro patches and to only use meloxicam as needed from here on out and to stop taking regularly.  He can follow-up with [place] as needed.

## 2020-05-20 NOTE — Progress Notes (Signed)
[facility]: Attending Note: I have reviewed the chart, discussed wit the Sports Medicine Fellow. I agree with assessment and treatment plan as detailed in the Fellow's note.  

## 2020-07-23 ENCOUNTER — Telehealth: Payer: Self-pay

## 2020-07-23 ENCOUNTER — Ambulatory Visit: Payer: Self-pay | Admitting: Physician Assistant

## 2020-07-23 ENCOUNTER — Other Ambulatory Visit: Payer: Self-pay

## 2020-07-23 VITALS — BP 167/105 | HR 71 | Temp 98.2°F | Resp 18

## 2020-07-23 DIAGNOSIS — E7849 Other hyperlipidemia: Secondary | ICD-10-CM

## 2020-07-23 DIAGNOSIS — R809 Proteinuria, unspecified: Secondary | ICD-10-CM

## 2020-07-23 DIAGNOSIS — I10 Essential (primary) hypertension: Secondary | ICD-10-CM

## 2020-07-23 DIAGNOSIS — F32A Depression, unspecified: Secondary | ICD-10-CM

## 2020-07-23 DIAGNOSIS — F1010 Alcohol abuse, uncomplicated: Secondary | ICD-10-CM

## 2020-07-23 DIAGNOSIS — E559 Vitamin D deficiency, unspecified: Secondary | ICD-10-CM

## 2020-07-23 DIAGNOSIS — E1165 Type 2 diabetes mellitus with hyperglycemia: Secondary | ICD-10-CM

## 2020-07-23 LAB — POCT GLYCOSYLATED HEMOGLOBIN (HGB A1C): Hemoglobin A1C: 7.6 % — AB (ref 4.0–5.6)

## 2020-07-23 MED ORDER — LISINOPRIL-HYDROCHLOROTHIAZIDE 20-12.5 MG PO TABS: 1.0000 | ORAL_TABLET | Freq: Every day | ORAL | 0 refills | Status: DC

## 2020-07-23 MED ORDER — METFORMIN HCL 1000 MG PO TABS: 1000.0000 mg | ORAL_TABLET | Freq: Two times a day (BID) | ORAL | 0 refills | Status: DC

## 2020-07-23 NOTE — Progress Notes (Signed)
Established Patient Office Visit  Subjective:  Patient ID: Nathan Duran, male    DOB: 29-Jul-1961  Age: 59 y.o. MRN: [id number]  CC:  Chief Complaint  Patient presents with  . Medication Refill    HPI [facility] SCHROADER reports that he has been out of his HTN and DM medications for the past month, states that he has been under increased stress due to declining health of his roommate.  Reports that he has not been checking his BP or BG at home.  States that he has resumed drinking alcohol.   States that his wife passed away 24 years ago and that he still has pent up anger over her death and has episodes of depression during the fall and winter, states that he has never been treated for this in the past.  Reports that he has continued his statin and OTC Vit D     Past Medical History:  Diagnosis Date  . Depression   . Diabetes mellitus   . Hypertension     Past Surgical History:  Procedure Laterality Date  . BACK SURGERY      Family History  Problem Relation Age of Onset  . Hypertension Mother   . Diabetes Mother   . Diabetes Father   . Hypertension Father     Social History   Socioeconomic History  . Marital status: Single    Spouse name: Not on file  . Number of children: Not on file  . Years of education: Not on file  . Highest education level: Not on file  Occupational History  . Not on file  Tobacco Use  . Smoking status: Never Smoker  . Smokeless tobacco: Never Used  Substance and Sexual Activity  . Alcohol use: Yes    Comment: 2-3 cans beer/night  . Drug use: No  . Sexual activity: Not Currently  Other Topics Concern  . Not on file  Social History Narrative  . Not on file   Social Determinants of Health   Financial Resource Strain:   . Difficulty of Paying Living Expenses: Not on file  Food Insecurity:   . Worried About Programme researcher, broadcasting/film/video in the Last Year: Not on file  . Ran Out of Food in the Last Year: Not on file  Transportation  Needs:   . Lack of Transportation (Medical): Not on file  . Lack of Transportation (Non-Medical): Not on file  Physical Activity:   . Days of Exercise per Week: Not on file  . Minutes of Exercise per Session: Not on file  Stress:   . Feeling of Stress : Not on file  Social Connections:   . Frequency of Communication with Friends and Family: Not on file  . Frequency of Social Gatherings with Friends and Family: Not on file  . Attends Religious Services: Not on file  . Active Member of Clubs or Organizations: Not on file  . Attends Banker Meetings: Not on file  . Marital Status: Not on file  Intimate Partner Violence:   . Fear of Current or Ex-Partner: Not on file  . Emotionally Abused: Not on file  . Physically Abused: Not on file  . Sexually Abused: Not on file    Outpatient Medications Prior to Visit  Medication Sig Dispense Refill  . atorvastatin (LIPITOR) 10 MG tablet Take 1 tablet (10 mg total) by mouth daily. 90 tablet 3  . cyclobenzaprine (FLEXERIL) 5 MG tablet Take 1-2 tablets (5-10 mg total) by mouth  2 (two) times daily as needed for muscle spasms. 24 tablet 0  . gabapentin (NEURONTIN) 300 MG capsule Take 1 capsule (300 mg total) by mouth at bedtime. 30 capsule 1  . ibuprofen (ADVIL) 800 MG tablet Take 1 tablet (800 mg total) by mouth 3 (three) times daily. 21 tablet 0  . meloxicam (MOBIC) 15 MG tablet Take 1 tablet (15 mg total) by mouth daily. 30 tablet 1  . lisinopril-hydrochlorothiazide (ZESTORETIC) 20-12.5 MG tablet Take 1 tablet by mouth daily. 60 tablet 0  . metFORMIN (GLUCOPHAGE) 1000 MG tablet Take 1 tablet (1,000 mg total) by mouth 2 (two) times daily with a meal. 120 tablet 0   No facility-administered medications prior to visit.    No Known Allergies  ROS Review of Systems  Constitutional: Negative.   HENT: Negative.   Eyes: Negative.   Respiratory: Negative for chest tightness and shortness of breath.   Cardiovascular: Negative for chest  pain.  Gastrointestinal: Negative.   Endocrine: Negative.   Genitourinary: Negative.   Musculoskeletal: Negative.   Skin: Negative.   Allergic/Immunologic: Negative.   Neurological: Negative.   Hematological: Negative.   Psychiatric/Behavioral: Positive for dysphoric mood. Negative for self-injury, sleep disturbance and suicidal ideas.      Objective:    Physical Exam Vitals and nursing note reviewed.  Constitutional:      General: He is not in acute distress.    Appearance: Normal appearance. He is not ill-appearing.  HENT:     Head: Normocephalic and atraumatic.     Right Ear: External ear normal.     Left Ear: External ear normal.     Nose: Nose normal.     Mouth/Throat:     Mouth: Mucous membranes are moist.     Pharynx: Oropharynx is clear.  Eyes:     Extraocular Movements: Extraocular movements intact.     Conjunctiva/sclera: Conjunctivae normal.     Pupils: Pupils are equal, round, and reactive to light.  Cardiovascular:     Rate and Rhythm: Normal rate and regular rhythm.     Pulses: Normal pulses.     Heart sounds: Normal heart sounds.  Pulmonary:     Effort: Pulmonary effort is normal.     Breath sounds: Normal breath sounds.  Abdominal:     General: Abdomen is flat.  Musculoskeletal:        General: Normal range of motion.     Cervical back: Normal range of motion and neck supple.  Skin:    General: Skin is warm and dry.  Neurological:     General: No focal deficit present.     Mental Status: He is alert and oriented to person, place, and time.  Psychiatric:        Mood and Affect: Mood normal.        Behavior: Behavior normal.        Thought Content: Thought content normal.        Judgment: Judgment normal.     BP (!) 167/105 (BP Location: Left Arm, Patient Position: Sitting, Cuff Size: Normal)   Pulse 71   Temp 98.2 F (36.8 C) (Oral)   Resp 18   SpO2 98%  Wt Readings from Last 3 Encounters:  05/17/20 175 lb (79.4 kg)  04/12/20 175 lb  (79.4 kg)  04/08/20 175 lb (79.4 kg)     Health Maintenance Due  Topic Date Due  . PNEUMOCOCCAL POLYSACCHARIDE VACCINE AGE 5-64 HIGH RISK  Never done  . FOOT EXAM  Never  done  . OPHTHALMOLOGY EXAM  Never done  . COVID-19 Vaccine (1) Never done  . COLONOSCOPY  Never done  . INFLUENZA VACCINE  Never done    There are no preventive care reminders to display for this patient.  Lab Results  Component Value Date   TSH 1.030 04/09/2020   Lab Results  Component Value Date   WBC 4.5 04/09/2020   HGB 14.4 04/09/2020   HCT 43.4 04/09/2020   MCV 93 04/09/2020   PLT 243 04/09/2020   Lab Results  Component Value Date   NA 139 04/09/2020   K 4.0 04/09/2020   CO2 28 10/12/2019   GLUCOSE 282 (H) 04/09/2020   BUN 15 04/09/2020   CREATININE 0.95 04/09/2020   BILITOT 0.9 04/09/2020   ALKPHOS 82 04/09/2020   AST 16 04/09/2020   ALT 31 12/09/2015   PROT 7.3 04/09/2020   ALBUMIN 4.3 04/09/2020   CALCIUM 9.9 04/09/2020   ANIONGAP 9 10/12/2019   Lab Results  Component Value Date   CHOL 263 (H) 04/09/2020   Lab Results  Component Value Date   HDL 72 04/09/2020   Lab Results  Component Value Date   LDLCALC 170 (H) 04/09/2020   Lab Results  Component Value Date   TRIG 118 04/09/2020   Lab Results  Component Value Date   CHOLHDL 3.7 04/09/2020   Lab Results  Component Value Date   HGBA1C 7.6 (A) 07/23/2020      Assessment & Plan:   Problem List Items Addressed This Visit      Cardiovascular and Mediastinum   Essential hypertension   Relevant Medications   lisinopril-hydrochlorothiazide (ZESTORETIC) 20-12.5 MG tablet     Endocrine   Uncontrolled type 2 diabetes mellitus with hyperglycemia (HCC)   Relevant Medications   metFORMIN (GLUCOPHAGE) 1000 MG tablet   lisinopril-hydrochlorothiazide (ZESTORETIC) 20-12.5 MG tablet   Other Relevant Orders   HgB A1c (Completed)     Other   Alcohol abuse    Other Visit Diagnoses    Other hyperlipidemia    -  Primary    Relevant Medications   lisinopril-hydrochlorothiazide (ZESTORETIC) 20-12.5 MG tablet   Vitamin D deficiency       Microalbuminuria       Depression, unspecified depression type        1. Other hyperlipidemia Continue current regimen  2. Vitamin D deficiency Continue current OTC regimen  3. Uncontrolled type 2 diabetes mellitus with hyperglycemia (HCC) Resume medication - metFORMIN (GLUCOPHAGE) 1000 MG tablet; Take 1 tablet (1,000 mg total) by mouth 2 (two) times daily with a meal.  Dispense: 120 tablet; Refill: 0 - HgB A1c  4. Essential hypertension Resume medication - lisinopril-hydrochlorothiazide (ZESTORETIC) 20-12.5 MG tablet; Take 1 tablet by mouth daily.  Dispense: 60 tablet; Refill: 0  5. Microalbuminuria   6. Alcohol abuse Agreeable to start counseling  7. Depression Appointment made on patients behalf to speak with [facility] counselor, [facility]  Patient education given on stress mgmt  Patient was given a Scientist, research (medical) along with appt and appt to establish PCP.  Meds ordered this encounter  Medications  . metFORMIN (GLUCOPHAGE) 1000 MG tablet    Sig: Take 1 tablet (1,000 mg total) by mouth 2 (two) times daily with a meal.    Dispense:  120 tablet    Refill:  0    Order Specific Question:   Supervising Provider    Answer:   Shan Levans E [[id number]]  . lisinopril-hydrochlorothiazide (ZESTORETIC) 20-12.5 MG tablet  Sig: Take 1 tablet by mouth daily.    Dispense:  60 tablet    Refill:  0    Order Specific Question:   Supervising Provider    Answer:   Storm Frisk [[id number]]    I have reviewed the patient's medical history (PMH, PSH, Social History, Family History, Medications, and allergies) , and have been updated if relevant. I spent 30 minutes reviewing chart and  face to face time with patient.     Follow-up: Return if symptoms worsen or fail to improve.    Kasandra Knudsen Mayers, PA-C

## 2020-07-23 NOTE — Telephone Encounter (Signed)
MMU case manger returned phone call from the patient about medical concern and locations of the unit.

## 2020-07-23 NOTE — Patient Instructions (Signed)
I encourage you to resume your medications, they were sent to your pharmacy.  We will call you with the appointment for financial assistance, your primary care provider and one to speak with a counselor  Please let us know if there is anything else we can do for you.  Kennieth Rad, PA-C Physician Assistant [place] [email or web address]    Managing Stress, Adult Feeling a certain amount of stress is normal. Stress helps our body and mind get ready to deal with the demands of life. Stress hormones can motivate you to do well at work and meet your responsibilities. However severe or long-lasting (chronic) stress can affect your mental and physical health. Chronic stress puts you at higher risk for anxiety, depression, and other health problems like digestive problems, muscle aches, heart disease, high blood pressure, and stroke. What are the causes? Common causes of stress include:  Demands from work, such as deadlines, feeling overworked, or having long hours.  Pressures at home, such as money issues, disagreements with a spouse, or parenting issues.  Pressures from major life changes, such as divorce, moving, loss of a loved one, or chronic illness. You may be at higher risk for stress-related problems if you do not get enough sleep, are in poor health, do not have emotional support, or have a mental health disorder like anxiety or depression. How to recognize stress Stress can make you:  Have trouble sleeping.  Feel sad, anxious, irritable, or overwhelmed.  Lose your appetite.  Overeat or want to eat unhealthy foods.  Want to use drugs or alcohol. Stress can also cause physical symptoms, such as:  Sore, tense muscles, especially in the shoulders and neck.  Headaches.  Trouble breathing.  A faster heart rate.  Stomach pain, nausea, or vomiting.  Diarrhea or constipation.  Trouble concentrating. Follow these  instructions at home: Lifestyle  Identify the source of your stress and your reaction to it. See a therapist who can help you change your reactions.  When there are stressful events: ? Talk about it with family, friends, or co-workers. ? Try to think realistically about stressful events and not ignore them or overreact. ? Try to find the positives in a stressful situation and not focus on the negatives. ? Cut back on responsibilities at work and home, if possible. Ask for help from friends or family members if you need it.  Find ways to cope with stress, such as: ? Meditation. ? Deep breathing. ? Yoga or tai chi. ? Progressive muscle relaxation. ? Doing art, playing music, or reading. ? Making time for fun activities. ? Spending time with family and friends.  Get support from family, friends, or spiritual resources. Eating and drinking  Eat a healthy diet. This includes: ? Eating foods that are high in fiber, such as beans, whole grains, and fresh fruits and vegetables. ? Limiting foods that are high in fat and processed sugars, such as fried and sweet foods.  Do not skip meals or overeat.  Drink enough fluid to keep your urine pale yellow. Alcohol use  Do not drink alcohol if: ? Your health care provider tells you not to drink. ? You are pregnant, may be pregnant, or are planning to become pregnant.  Drinking alcohol is a way some people try to ease their stress. This can be dangerous, so if you drink alcohol: ? Limit how much you use to:  0-1 drink a day for women.  0-2 drinks a day for men. ? Be  aware of how much alcohol is in your drink. In the [place] one drink equals one 12 oz bottle of beer (355 mL), one 5 oz glass of wine (148 mL), or one 1 oz glass of hard liquor (44 mL). Activity   Include 30 minutes of exercise in your daily schedule. Exercise is a good stress reducer.  Include time in your day for an activity that you find relaxing. Try taking a walk, going  on a bike ride, reading a book, or listening to music.  Schedule your time in a way that lowers stress, and keep a consistent schedule. Prioritize what is most important to get done. General instructions  Get enough sleep. Try to go to sleep and get up at about the same time every day.  Take over-the-counter and prescription medicines only as told by your health care provider.  Do not use any products that contain nicotine or tobacco, such as cigarettes, e-cigarettes, and chewing tobacco. If you need help quitting, ask your health care provider.  Do not use drugs or smoke to cope with stress.  Keep all follow-up visits as told by your health care provider. This is important. Where to find support  Talk with your health care provider about stress management or finding a support group.  Find a therapist to work with you on your stress management techniques. Contact a health care provider if:  Your stress symptoms get worse.  You are unable to manage your stress at home.  You are struggling to stop using drugs or alcohol. Get help right away if:  You may be a danger to yourself or others.  You have any thoughts of death or suicide. If you ever feel like you may hurt yourself or others, or have thoughts about taking your own life, get help right away. You can go to your nearest emergency department or call:  Your local emergency services (911 in the [place]  A suicide crisis helpline, such as the [place] at 207-762-3732. This is open 24 hours a day. Summary  Feeling a certain amount of stress is normal, but severe or long-lasting (chronic) stress can affect your mental and physical health.  Chronic stress can put you at higher risk for anxiety, depression, and other health problems like digestive problems, muscle aches, heart disease, high blood pressure, and stroke.  You may be at higher risk for stress-related problems if you do not get enough  sleep, are in poor health, lack emotional support, or have a mental health disorder like anxiety or depression.  Identify the source of your stress and your reaction to it. Try talking about stressful events with family, friends, or co-workers, finding a coping method, or getting support from spiritual resources.  If you need more help, talk with your health care provider about finding a support group or a mental health therapist. This information is not intended to replace advice given to you by your health care provider. Make sure you discuss any questions you have with your health care provider. Document Revised: 04/26/2019 Document Reviewed: 04/26/2019 Elsevier Patient Education  [place].

## 2020-07-24 ENCOUNTER — Telehealth: Payer: Self-pay

## 2020-07-24 DIAGNOSIS — E7849 Other hyperlipidemia: Secondary | ICD-10-CM | POA: Insufficient documentation

## 2020-07-24 DIAGNOSIS — F32A Depression, unspecified: Secondary | ICD-10-CM | POA: Insufficient documentation

## 2020-07-24 DIAGNOSIS — E559 Vitamin D deficiency, unspecified: Secondary | ICD-10-CM | POA: Insufficient documentation

## 2020-07-24 DIAGNOSIS — R809 Proteinuria, unspecified: Secondary | ICD-10-CM | POA: Insufficient documentation

## 2020-07-24 NOTE — Telephone Encounter (Signed)
MMU Case Manager reaching out to discuss following appointments for the patient. LM with roommate. Will call patient back to discuss.

## 2020-07-25 ENCOUNTER — Telehealth: Payer: Self-pay

## 2020-07-25 NOTE — Telephone Encounter (Signed)
MMU Case Manager contacted patient on their upcoming appointments.

## 2020-07-29 ENCOUNTER — Institutional Professional Consult (permissible substitution): Payer: Self-pay | Admitting: Licensed Clinical Social Worker

## 2020-07-31 ENCOUNTER — Ambulatory Visit: Payer: Self-pay | Attending: Family Medicine | Admitting: Licensed Clinical Social Worker

## 2020-07-31 DIAGNOSIS — F4323 Adjustment disorder with mixed anxiety and depressed mood: Secondary | ICD-10-CM

## 2020-08-14 ENCOUNTER — Other Ambulatory Visit: Payer: Self-pay

## 2020-08-14 ENCOUNTER — Telehealth: Payer: Self-pay | Admitting: Licensed Clinical Social Worker

## 2020-08-14 ENCOUNTER — Ambulatory Visit: Payer: Self-pay | Attending: Family Medicine | Admitting: Licensed Clinical Social Worker

## 2020-08-14 NOTE — Telephone Encounter (Signed)
Call placed to patient regarding scheduled IBH appointment. Spoke with friend, Leodis Binet, who shared she would provide pt with message to return the call.

## 2020-08-19 ENCOUNTER — Other Ambulatory Visit: Payer: Self-pay

## 2020-08-19 NOTE — BH Specialist Note (Signed)
Integrated Behavioral Health Visit via Telemedicine (Telephone)  07/31/2020 Nathan Duran [id number]  Number of Integrated Behavioral Health visits: 1 Session Start time: 3:05 PM  Session End time: 3:25 PM Total time: 20 minutes  Referring Provider: PA-C Mayers Type of Service: Individual Patient location: Home [facility] Provider location: Office All persons participating in visit: LCSW and Patient   I connected with Nathan Duran  by telephone and verified that I am speaking with the correct person using two identifiers.   Discussed confidentiality: Yes   Confirmed demographics & insurance:  Yes   I discussed that engaging in this virtual visit, they consent to the provision of behavioral healthcare and the services will be billed under their insurance.   Patient and/or legal guardian expressed understanding and consented to virtual visit: Yes   PRESENTING CONCERNS: Patient or family reports the following symptoms/concerns: Pt reports difficulty managing depression and anxiety symptoms triggered by grief of spouse 24 years ago, management of chronic medical conditions, and caregiver stress of best-friend/room-mate Duration of problem: Ongoing; Severity of problem: moderate  STRENGTHS (Protective Factors/Coping Skills): Social connections, Social and Emotional competence, Concrete supports in place (healthy food, safe environments, etc.) and Sense of purpose  ASSESSMENT: Patient currently experiencing difficulty managing depression and anxiety symptoms, including, irritability, difficulty sleeping, and hx of suicidal ideations. He endorses continued alcohol use.  Pt will benefit from therapy and medication management. Protective factors were identified and crisis intervention resources were discussed.    GOALS ADDRESSED: Patient will: 1.  Increase knowledge and/or ability of: coping skills Pt agreed to continue spending time with loved ones [name], grandchildren and  daughter) to promote positive feelings 2.  Demonstrate ability to: Decrease self-medicating behaviors Pt agreed to continue reducing alcohol use  Progress of Goals: Ongoing  INTERVENTIONS: Interventions utilized:  Solution-Focused Strategies, Supportive Counseling and Psychoeducation and/or Health Education Standardized Assessments completed & reviewed: Not Needed   OUTCOME: Patient Response: Pt was engaged during session and was successful in identifying healthy strategies to assist in management of symptoms.    PLAN: 1. Follow up with behavioral health clinician on : 08/14/2020 2. Behavioral recommendations: Utilize strategies discussed, reduce alcohol use, and use crisis intervention resources, if needed 3. Referral(s): Integrated [employer] (In Clinic)  I discussed the assessment and treatment plan with the patient and/or parent/guardian. They were provided an opportunity to ask questions and all were answered. They agreed with the plan and demonstrated an understanding of the instructions.   They were advised to call back or seek an in-person evaluation as appropriate.  I discussed that the purpose of this visit is to provide behavioral health care while limiting exposure to the novel coronavirus.  Discussed there is a possibility of technology failure and discussed alternative modes of communication if that failure occurs.  [name], LCSW 08/19/2020 7:59 AM

## 2020-09-12 ENCOUNTER — Ambulatory Visit: Payer: Self-pay | Admitting: Critical Care Medicine

## 2020-09-12 NOTE — Progress Notes (Deleted)
   Subjective:    Patient ID: Nathan Duran, male    DOB: [birth year], 59 y.o.   MRN: [id number]  HPI    Review of Systems     Objective:   Physical Exam        Assessment & Plan:

## 2021-06-08 ENCOUNTER — Other Ambulatory Visit: Payer: Self-pay | Admitting: Physician Assistant

## 2021-06-08 DIAGNOSIS — E1165 Type 2 diabetes mellitus with hyperglycemia: Secondary | ICD-10-CM

## 2021-06-08 DIAGNOSIS — I10 Essential (primary) hypertension: Secondary | ICD-10-CM

## 2021-07-04 ENCOUNTER — Other Ambulatory Visit: Payer: Self-pay

## 2021-07-04 ENCOUNTER — Encounter (HOSPITAL_COMMUNITY): Payer: Self-pay | Admitting: Emergency Medicine

## 2021-07-04 ENCOUNTER — Ambulatory Visit (HOSPITAL_COMMUNITY)
Admission: EM | Admit: 2021-07-04 | Discharge: 2021-07-04 | Disposition: A | Discharge status: Home / Self Care | Payer: 59 | Attending: Physician Assistant | Admitting: Physician Assistant

## 2021-07-04 DIAGNOSIS — Z7984 Long term (current) use of oral hypoglycemic drugs: Secondary | ICD-10-CM

## 2021-07-04 DIAGNOSIS — E1165 Type 2 diabetes mellitus with hyperglycemia: Secondary | ICD-10-CM | POA: Insufficient documentation

## 2021-07-04 DIAGNOSIS — I10 Essential (primary) hypertension: Secondary | ICD-10-CM | POA: Insufficient documentation

## 2021-07-04 DIAGNOSIS — Z760 Encounter for issue of repeat prescription: Secondary | ICD-10-CM | POA: Insufficient documentation

## 2021-07-04 DIAGNOSIS — E1159 Type 2 diabetes mellitus with other circulatory complications: Secondary | ICD-10-CM

## 2021-07-04 LAB — COMPREHENSIVE METABOLIC PANEL
ALT: 20 U/L (ref 0–44)
AST: 21 U/L (ref 15–41)
Albumin: 4.2 g/dL (ref 3.5–5.0)
Alkaline Phosphatase: 71 U/L (ref 38–126)
Anion gap: 8 (ref 5–15)
BUN: 9 mg/dL (ref 6–20)
CO2: 28 mmol/L (ref 22–32)
Calcium: 9.4 mg/dL (ref 8.9–10.3)
Chloride: 100 mmol/L (ref 98–111)
Creatinine, Ser: 0.86 mg/dL (ref 0.61–1.24)
GFR, Estimated: >60 mL/min (ref >60)
Glucose, Bld: 241 mg/dL — ABNORMAL HIGH (ref 70–99)
Potassium: 3.8 mmol/L (ref 3.5–5.1)
Sodium: 136 mmol/L (ref 135–145)
Total Bilirubin: 1 mg/dL (ref 0.3–1.2)
Total Protein: 8.2 g/dL — ABNORMAL HIGH (ref 6.5–8.1)

## 2021-07-04 LAB — CBC
HCT: 42.1 % (ref 39.0–52.0)
Hemoglobin: 14.3 g/dL (ref 13.0–17.0)
MCH: 30.8 pg (ref 26.0–34.0)
MCHC: 34 g/dL (ref 30.0–36.0)
MCV: 90.7 fL (ref 80.0–100.0)
Platelets: 257 10*3/uL (ref 150–400)
RBC: 4.64 MIL/uL (ref 4.22–5.81)
RDW: 12.4 % (ref 11.5–15.5)
WBC: 4.7 10*3/uL (ref 4.0–10.5)
nRBC: 0 % (ref 0.0–0.2)

## 2021-07-04 LAB — CBG MONITORING, ED: Glucose-Capillary: 215 mg/dL — ABNORMAL HIGH (ref 70–99)

## 2021-07-04 MED ORDER — ATORVASTATIN CALCIUM 10 MG PO TABS: 10.0000 mg | ORAL_TABLET | Freq: Every day | ORAL | 1 refills | Status: DC

## 2021-07-04 MED ORDER — LISINOPRIL-HYDROCHLOROTHIAZIDE 20-12.5 MG PO TABS: 1.0000 | ORAL_TABLET | Freq: Every day | ORAL | 1 refills | Status: DC

## 2021-07-04 MED ORDER — METFORMIN HCL 1000 MG PO TABS: 1000.0000 mg | ORAL_TABLET | Freq: Two times a day (BID) | ORAL | 1 refills | Status: DC

## 2021-07-04 NOTE — ED Provider Notes (Signed)
[facility]    CSN: [id number] Arrival date & time: 07/04/21  1215      History   Chief Complaint No chief complaint on file.   HPI Nathan Duran is a 60 y.o. male.   Patient presents today for medication management and refills.  He has a history of diabetes currently managed with metformin 1000 mg twice daily.  Last A1c was 7.6% on 07/23/2020.  Patient does report some dietary discretion with eating sugar but also reports significant exercises he walks to and from the bus stop twice daily.  He is not monitoring his blood sugar at home.  He has not seen an eye doctor in the past year but recently got insurance and so is hoping to schedule an appointment in the near future.  He denies any polyuria, polydipsia, polyphagia, symptomatic hyper or hypoglycemia, neuropathy symptoms.  Denies any side effects or concerns with metformin.  Reports last taking this medication approximately 2 months ago.  In addition, patient has a history of essential hypertension managed with hydrochlorothiazide/lisinopril combination.  He has been without this medication for several months.  He is not monitoring his blood pressure at home.  He does try to monitor his diet for salt.  He is physically active.  He denies any chest pain, shortness of breath, leg swelling, vision changes, headache, dizziness.   Past Medical History:  Diagnosis Date   Depression    Diabetes mellitus    Hypertension     Patient Active Problem List   Diagnosis Date Noted   Vitamin D deficiency 07/24/2020   Other hyperlipidemia 07/24/2020   Depression 07/24/2020   Microalbuminuria 07/24/2020   Essential hypertension 04/08/2020   Uncontrolled type 2 diabetes mellitus with hyperglycemia (HCC) 04/08/2020   Alcohol abuse 04/08/2020   Achilles tendinitis, left leg 01/05/2020   Cervical muscle strain 01/05/2020    Past Surgical History:  Procedure Laterality Date   BACK SURGERY         Home Medications     Prior to Admission medications   Medication Sig Start Date End Date Taking? Authorizing Provider  atorvastatin (LIPITOR) 10 MG tablet Take 1 tablet (10 mg total) by mouth daily. 07/04/21   Rashmi Tallent, Noberto Retort, PA-C  gabapentin (NEURONTIN) 300 MG capsule Take 1 capsule (300 mg total) by mouth at bedtime. 03/15/20   Marca Ancona, MD  ibuprofen (ADVIL) 800 MG tablet Take 1 tablet (800 mg total) by mouth 3 (three) times daily. 11/19/19   Wieters, Hallie C, PA-C  lisinopril-hydrochlorothiazide (ZESTORETIC) 20-12.5 MG tablet Take 1 tablet by mouth daily. 07/04/21 09/02/21  Dorita Rowlands, Noberto Retort, PA-C  metFORMIN (GLUCOPHAGE) 1000 MG tablet Take 1 tablet (1,000 mg total) by mouth 2 (two) times daily with a meal. 07/04/21 09/02/21  Knowledge Escandon K, PA-C  ipratropium (ATROVENT) 0.06 % nasal spray Place 2 sprays into both nostrils 4 (four) times daily. 12/26/18 11/19/19  Georgetta Haber, NP    Family History Family History  Problem Relation Age of Onset   Hypertension Mother    Diabetes Mother    Diabetes Father    Hypertension Father     Social History Social History   Tobacco Use   Smoking status: Never   Smokeless tobacco: Never  Substance Use Topics   Alcohol use: Yes    Comment: 2-3 cans beer/night   Drug use: No     Allergies   Patient has no known allergies.   Review of Systems Review of Systems  Constitutional:  Negative for activity change, appetite change, fatigue and fever.  Eyes:  Negative for photophobia and visual disturbance.  Respiratory:  Negative for cough and shortness of breath.   Cardiovascular:  Negative for chest pain.  Gastrointestinal:  Negative for abdominal pain, diarrhea, nausea and vomiting.  Endocrine: Negative for polydipsia, polyphagia and polyuria.  Neurological:  Negative for dizziness, light-headedness and headaches.    Physical Exam Triage Vital Signs ED Triage Vitals  Enc Vitals Group     BP 07/04/21 1403 (!) 179/109     Pulse Rate 07/04/21 1403 80      Resp 07/04/21 1403 18     Temp 07/04/21 1403 98.5 F (36.9 C)     Temp src --      SpO2 07/04/21 1403 97 %     Weight --      Height --      Head Circumference --      Peak Flow --      Pain Score 07/04/21 1401 0     Pain Loc --      Pain Edu? --      Excl. in GC? --    No data found.  Updated Vital Signs BP (!) 179/109   Pulse 80   Temp 98.5 F (36.9 C)   Resp 18   SpO2 97%   Visual Acuity Right Eye Distance:   Left Eye Distance:   Bilateral Distance:    Right Eye Near:   Left Eye Near:    Bilateral Near:     Physical Exam Vitals reviewed.  Constitutional:      General: He is awake.     Appearance: Normal appearance. He is well-developed. He is not ill-appearing.     Comments: Very pleasant male appears stated age no acute distress sitting comfortably in exam room  HENT:     Head: Normocephalic and atraumatic.  Cardiovascular:     Rate and Rhythm: Normal rate and regular rhythm.     Pulses:          Radial pulses are 2+ on the right side and 2+ on the left side.     Heart sounds: Normal heart sounds, S1 normal and S2 normal. No murmur heard. Pulmonary:     Effort: Pulmonary effort is normal.     Breath sounds: Normal breath sounds. No stridor. No wheezing, rhonchi or rales.     Comments: Clear to auscultation bilaterally Abdominal:     General: Bowel sounds are normal.     Palpations: Abdomen is soft.     Tenderness: There is no abdominal tenderness.  Musculoskeletal:     Right lower leg: No edema.     Left lower leg: No edema.  Neurological:     Mental Status: He is alert.  Psychiatric:        Behavior: Behavior is cooperative.     UC Treatments / Results  Labs (all labs ordered are listed, but only abnormal results are displayed) Labs Reviewed  CBG MONITORING, ED - Abnormal; Notable for the following components:      Result Value   Glucose-Capillary 215 (*)    All other components within normal limits  CBC  COMPREHENSIVE METABOLIC PANEL     EKG   Radiology No results found.  Procedures Procedures (including critical care time)  Medications Ordered in UC Medications - No data to display  Initial Impression / Assessment and Plan / UC Course  I have reviewed the triage vital signs and the nursing  notes.  Pertinent labs & imaging results that were available during my care of the patient were reviewed by me and considered in my medical decision making (see chart for details).     Patient denies any significant or concerning symptoms.  Random glucose was 215 in clinic today.  Recommended that he restart metformin by taking 1 tablet daily for 1 week then increase to 1 tablet twice daily.  Refill of statin was also sent to pharmacy as patient requested.  Discussed that he should continue with regular exercise and avoid carbohydrates.  He is to rest and drink plenty of fluid.  Discussed the importance of following up with primary care provider for ongoing management and regular blood testing such as A1c.  Also discussed that he is overdue for ophthalmology exam and encouraged him to follow-up with PCP for referral.  Discussed alarm symptoms that warrant emergent evaluation.  Strict return precautions given to which she expressed understanding.  Blood pressure is very elevated today but patient denies any signs/symptoms of endorgan damage.  We will restart medication as previously prescribed.  Please monitor your blood pressure at home and if this remains elevated above 140/90 you need to be reevaluated.  Someone needs to recheck her blood pressure within 1 to 2 weeks if you cannot see your primary care provider within this timeframe please return to our clinic.  Avoid decongestions, salt, caffeine is much as possible.  If you have chest pain, shortness of breath, headache, dizziness, leg swelling, vision changes in the setting of high blood pressure you must go to the emergency room.  Final Clinical Impressions(s) / UC Diagnoses    Final diagnoses:  Uncontrolled type 2 diabetes mellitus with hyperglycemia (HCC)  Essential hypertension  Elevated blood pressure reading in office with diagnosis of hypertension  Medication refill     Discharge Instructions      I have called in refills of your medication.  Please restart these as previously prescribed.  Monitor your blood sugar and blood pressure regularly.  If this remains elevated you need to be reevaluated.  We are going to try to find you a primary care provider as you need to see them for ongoing medication management.  We did order labs and we will contact you if these are abnormal.  Continue with your regular exercise.  As we discussed, if you have any headache, dizziness, chest pain, shortness of breath in setting of high blood pressure you need to go to the emergency room.     ED Prescriptions     Medication Sig Dispense Auth. Provider   metFORMIN (GLUCOPHAGE) 1000 MG tablet Take 1 tablet (1,000 mg total) by mouth 2 (two) times daily with a meal. 60 tablet Hadassa Cermak K, PA-C   lisinopril-hydrochlorothiazide (ZESTORETIC) 20-12.5 MG tablet Take 1 tablet by mouth daily. 30 tablet Unique Searfoss K, PA-C   atorvastatin (LIPITOR) 10 MG tablet Take 1 tablet (10 mg total) by mouth daily. 30 tablet Markella Dao, Noberto Retort, PA-C      PDMP not reviewed this encounter.   Jeani Hawking, PA-C 07/04/21 1511

## 2021-07-04 NOTE — Discharge Instructions (Signed)
I have called in refills of your medication.  Please restart these as previously prescribed.  Monitor your blood sugar and blood pressure regularly.  If this remains elevated you need to be reevaluated.  We are going to try to find you a primary care provider as you need to see them for ongoing medication management.  We did order labs and we will contact you if these are abnormal.  Continue with your regular exercise.  As we discussed, if you have any headache, dizziness, chest pain, shortness of breath in setting of high blood pressure you need to go to the emergency room.

## 2021-07-04 NOTE — ED Triage Notes (Signed)
Pt is present today for medication refill. Pt states that he needs a refill for BP and diabetes medication. Pt states that he ran out of medication two months ago

## 2021-08-20 ENCOUNTER — Encounter (HOSPITAL_COMMUNITY): Payer: Self-pay | Admitting: Emergency Medicine

## 2021-08-20 ENCOUNTER — Other Ambulatory Visit: Payer: Self-pay

## 2021-08-20 ENCOUNTER — Ambulatory Visit (HOSPITAL_COMMUNITY)
Admission: EM | Admit: 2021-08-20 | Discharge: 2021-08-20 | Disposition: A | Discharge status: Home / Self Care | Payer: Managed Care, Other (non HMO) | Attending: Family Medicine | Admitting: Family Medicine

## 2021-08-20 DIAGNOSIS — M546 Pain in thoracic spine: Secondary | ICD-10-CM

## 2021-08-20 MED ORDER — PREDNISONE 10 MG PO TABS: ORAL_TABLET | ORAL | 0 refills | Status: AC

## 2021-08-20 NOTE — ED Triage Notes (Signed)
Pt is present today with left side shoulder blade pain radiating down to his inner forearm. Pt denies any chest pain or SOB. Pt sx started last night

## 2021-08-20 NOTE — Discharge Instructions (Addendum)
As we discussed, the pain in your upper back/shoulder is likely radiating from your neck.  I have sent a prescription to the pharmacy for a steroid which should help calm down the pain and inflammation, but it is important that you monitor your sugars very closely while you are on this as they can increase your sugars.  Use heat as well and make sure that you have a good pillow.  If you are not having improvement in your pain, especially if you have significant worsening, you develop difficulty breathing, chest pain, you should be seen by medical provider right away.

## 2021-08-20 NOTE — ED Provider Notes (Signed)
[facility]    CSN: [id number] Arrival date & time: 08/20/21  0950      History   Chief Complaint Chief Complaint  Patient presents with   Shoulder Pain    Shoulder blade pain    HPI Nathan Duran is a 60 y.o. male.   Left Shoulder Blade Pain Started this morning when he woke up All around shoulder blade and worsens with movement of neck Hurts some to move his shoulder, but not as bad as moving neck Some numbness and tingling into his upper arm, but no into fingers Has tried tylenol without improvement in pain Has had similar pains like this, but never this severe No chest pain or shortness of breath No injury Does a lot of manual labor for work   Past Medical History:  Diagnosis Date   Depression    Diabetes mellitus    Hypertension     Patient Active Problem List   Diagnosis Date Noted   Vitamin D deficiency 07/24/2020   Other hyperlipidemia 07/24/2020   Depression 07/24/2020   Microalbuminuria 07/24/2020   Essential hypertension 04/08/2020   Uncontrolled type 2 diabetes mellitus with hyperglycemia (HCC) 04/08/2020   Alcohol abuse 04/08/2020   Achilles tendinitis, left leg 01/05/2020   Cervical muscle strain 01/05/2020    Past Surgical History:  Procedure Laterality Date   BACK SURGERY         Home Medications    Prior to Admission medications   Medication Sig Start Date End Date Taking? Authorizing Provider  predniSONE (DELTASONE) 10 MG tablet Take 6 tablets (60 mg total) by mouth daily for 1 day, THEN 5 tablets (50 mg total) daily for 1 day, THEN 4 tablets (40 mg total) daily for 1 day, THEN 3 tablets (30 mg total) daily for 1 day, THEN 2 tablets (20 mg total) daily for 1 day, THEN 1 tablet (10 mg total) daily for 1 day. 08/20/21 08/26/21 Yes Shirah Roseman, Solmon Ice, DO  atorvastatin (LIPITOR) 10 MG tablet Take 1 tablet (10 mg total) by mouth daily. 07/04/21   Raspet, Noberto Retort, PA-C  gabapentin (NEURONTIN) 300 MG capsule Take 1 capsule  (300 mg total) by mouth at bedtime. 03/15/20   Marca Ancona, MD  ibuprofen (ADVIL) 800 MG tablet Take 1 tablet (800 mg total) by mouth 3 (three) times daily. 11/19/19   Wieters, Hallie C, PA-C  lisinopril-hydrochlorothiazide (ZESTORETIC) 20-12.5 MG tablet Take 1 tablet by mouth daily. 07/04/21 09/02/21  Raspet, Noberto Retort, PA-C  metFORMIN (GLUCOPHAGE) 1000 MG tablet Take 1 tablet (1,000 mg total) by mouth 2 (two) times daily with a meal. 07/04/21 09/02/21  Raspet, Erin K, PA-C  ipratropium (ATROVENT) 0.06 % nasal spray Place 2 sprays into both nostrils 4 (four) times daily. 12/26/18 11/19/19  Georgetta Haber, NP    Family History Family History  Problem Relation Age of Onset   Hypertension Mother    Diabetes Mother    Diabetes Father    Hypertension Father     Social History Social History   Tobacco Use   Smoking status: Never   Smokeless tobacco: Never  Substance Use Topics   Alcohol use: Yes    Comment: 2-3 cans beer/night   Drug use: No     Allergies   Patient has no known allergies.   Review of Systems Review of Systems  All other systems reviewed and are negative. Per HPI  Physical Exam Triage Vital Signs ED Triage Vitals  Enc Vitals Group  BP 08/20/21 1000 (!) 153/88     Pulse Rate 08/20/21 1000 85     Resp 08/20/21 1000 18     Temp 08/20/21 1000 98.2 F (36.8 C)     Temp src --      SpO2 08/20/21 1000 97 %     Weight --      Height --      Head Circumference --      Peak Flow --      Pain Score 08/20/21 0959 10     Pain Loc --      Pain Edu? --      Excl. in GC? --    No data found.  Updated Vital Signs BP (!) 153/88   Pulse 85   Temp 98.2 F (36.8 C)   Resp 18   SpO2 97%   Visual Acuity Right Eye Distance:   Left Eye Distance:   Bilateral Distance:    Right Eye Near:   Left Eye Near:    Bilateral Near:     Physical Exam Constitutional:      General: He is not in acute distress.    Appearance: Normal appearance. He is not  ill-appearing or toxic-appearing.  HENT:     Head: Normocephalic and atraumatic.  Neck:     Comments: Neck/Back: - Inspection: no gross deformity or asymmetry, swelling or ecchymosis - Palpation: No TTP spinous process, there is TTP throughout rhomboid and trapezius on left with pressure point at mid scapula level on left which reproduces his pain - ROM: full active ROM of the cervical spine with neck extension, rotation, flexion - pain in all directions - Strength: 5/5 wrist flexion, extension, biceps flexion, triceps extension. OK sign, interosseus strength intact  - Neuro: sensation intact in the C5-C8 nerve root distribution b/l, 2+ C5-C7 reflexes - Special testing: negative spurling's  Full ROM of bilateral shoulders with some pain in posterior periscapular region, but otherwise no pain, negative Hawkins    Cardiovascular:     Rate and Rhythm: Normal rate and regular rhythm.  Pulmonary:     Effort: Pulmonary effort is normal. No respiratory distress.  Skin:    General: Skin is warm and dry.  Neurological:     General: No focal deficit present.     Mental Status: He is alert and oriented to person, place, and time.     UC Treatments / Results  Labs (all labs ordered are listed, but only abnormal results are displayed) Labs Reviewed - No data to display  EKG Reviewed, no acute changes.  Radiology No results found.  Procedures Procedures (including critical care time)  Medications Ordered in UC Medications - No data to display  Initial Impression / Assessment and Plan / UC Course  I have reviewed the triage vital signs and the nursing notes.  Pertinent labs & imaging results that were available during my care of the patient were reviewed by me and considered in my medical decision making (see chart for details).     Left periscapular pain with spasm within rhomboids and trapezius and reproduction of pain with palpation suggestive of radiation from cervical spine,  no evidence of neurologic findings on examination.  He is otherwise well-appearing.  Very low suspicion for cardiac cause, normal EKG.  We will treat with prednisone Dosepak and heat.  Advised to follow-up immediately if he is having worsening of pain, especially if he develops chest pain, shortness of breath.  Advised that if he is not improving  that he needs to be seen by medical provider right away.  Written out of work for the next 2 days given his physically demanding job.  He reports that his sugars are usually well controlled, advised monitoring CBGs closely while on prednisone.   Final Clinical Impressions(s) / UC Diagnoses   Final diagnoses:  Acute left-sided thoracic back pain     Discharge Instructions      As we discussed, the pain in your upper back/shoulder is likely radiating from your neck.  I have sent a prescription to the pharmacy for a steroid which should help calm down the pain and inflammation, but it is important that you monitor your sugars very closely while you are on this as they can increase your sugars.  Use heat as well and make sure that you have a good pillow.  If you are not having improvement in your pain, especially if you have significant worsening, you develop difficulty breathing, chest pain, you should be seen by medical provider right away.     ED Prescriptions     Medication Sig Dispense Auth. Provider   predniSONE (DELTASONE) 10 MG tablet Take 6 tablets (60 mg total) by mouth daily for 1 day, THEN 5 tablets (50 mg total) daily for 1 day, THEN 4 tablets (40 mg total) daily for 1 day, THEN 3 tablets (30 mg total) daily for 1 day, THEN 2 tablets (20 mg total) daily for 1 day, THEN 1 tablet (10 mg total) daily for 1 day. 21 tablet Sabrena Gavitt, Solmon Ice, DO      PDMP not reviewed this encounter.   Alaiya Martindelcampo, Solmon Ice, DO 08/20/21 1238

## 2022-01-04 ENCOUNTER — Encounter (HOSPITAL_COMMUNITY): Payer: Self-pay | Admitting: *Deleted

## 2022-01-04 ENCOUNTER — Other Ambulatory Visit: Payer: Self-pay

## 2022-01-04 ENCOUNTER — Ambulatory Visit (HOSPITAL_COMMUNITY)
Admission: EM | Admit: 2022-01-04 | Discharge: 2022-01-04 | Disposition: A | Discharge status: Home / Self Care | Payer: Managed Care, Other (non HMO) | Attending: Family Medicine | Admitting: Family Medicine

## 2022-01-04 DIAGNOSIS — E1159 Type 2 diabetes mellitus with other circulatory complications: Secondary | ICD-10-CM

## 2022-01-04 DIAGNOSIS — E1165 Type 2 diabetes mellitus with hyperglycemia: Secondary | ICD-10-CM

## 2022-01-04 DIAGNOSIS — I10 Essential (primary) hypertension: Secondary | ICD-10-CM | POA: Diagnosis not present

## 2022-01-04 LAB — CBG MONITORING, ED: Glucose-Capillary: 316 mg/dL — ABNORMAL HIGH (ref 70–99)

## 2022-01-04 MED ORDER — LISINOPRIL-HYDROCHLOROTHIAZIDE 20-12.5 MG PO TABS: 1.0000 | ORAL_TABLET | Freq: Every day | ORAL | 2 refills | Status: DC

## 2022-01-04 MED ORDER — METFORMIN HCL 1000 MG PO TABS: 1000.0000 mg | ORAL_TABLET | Freq: Two times a day (BID) | ORAL | 2 refills | Status: DC

## 2022-01-04 NOTE — ED Triage Notes (Signed)
Pt reports his Blood Sugar is high and His BP is high. However Pt reports he has not checked BP or his blood sugar today. ?

## 2022-01-04 NOTE — Discharge Instructions (Addendum)
Restart lisinopril HCT 20-12.5 mg--1 daily for blood pressure ? ?Restart metformin 1000 mg--1 tablet twice daily with a meal for diabetes ? ? ?

## 2022-01-04 NOTE — ED Provider Notes (Signed)
?[facility] ? ? ? ?CSN: [id number] ?Arrival date & time: 01/04/22  1316 ? ? ?  ? ?History   ?Chief Complaint ?Chief Complaint  ?Patient presents with  ? Hyperglycemia  ? Hypertension  ? ? ?HPI ?Nathan Duran is a 61 y.o. male.  ? ? ?Hyperglycemia ?Hypertension ? ?Here for fatigue and headache.  He feels that the fatigue is due to his blood sugar most likely being elevated lately been out of his medication. ? ?Also feels that his headache is due to his blood pressure as he has been out of those medications also.  He is still not found him a new primary doctor. ? ?No fever, URI symptoms, or vomiting ? ?Past Medical History:  ?Diagnosis Date  ? Depression   ? Diabetes mellitus   ? Hypertension   ? ? ?Patient Active Problem List  ? Diagnosis Date Noted  ? Vitamin D deficiency 07/24/2020  ? Other hyperlipidemia 07/24/2020  ? Depression 07/24/2020  ? Microalbuminuria 07/24/2020  ? Essential hypertension 04/08/2020  ? Uncontrolled type 2 diabetes mellitus with hyperglycemia (HCC) 04/08/2020  ? Alcohol abuse 04/08/2020  ? Achilles tendinitis, left leg 01/05/2020  ? Cervical muscle strain 01/05/2020  ? ? ?Past Surgical History:  ?Procedure Laterality Date  ? BACK SURGERY    ? ? ? ? ? ?Home Medications   ? ?Prior to Admission medications   ?Medication Sig Start Date End Date Taking? Authorizing Provider  ?atorvastatin (LIPITOR) 10 MG tablet Take 1 tablet (10 mg total) by mouth daily. 07/04/21   Raspet, Noberto Retort, PA-C  ?gabapentin (NEURONTIN) 300 MG capsule Take 1 capsule (300 mg total) by mouth at bedtime. 03/15/20   Marca Ancona, MD  ?lisinopril-hydrochlorothiazide (ZESTORETIC) 20-12.5 MG tablet Take 1 tablet by mouth daily. 01/04/22 03/05/22  Zenia Resides, MD  ?metFORMIN (GLUCOPHAGE) 1000 MG tablet Take 1 tablet (1,000 mg total) by mouth 2 (two) times daily with a meal. 01/04/22 03/05/22  Zenia Resides, MD  ?ipratropium (ATROVENT) 0.06 % nasal spray Place 2 sprays into both nostrils 4 (four) times  daily. 12/26/18 11/19/19  Georgetta Haber, NP  ? ? ?Family History ?Family History  ?Problem Relation Age of Onset  ? Hypertension Mother   ? Diabetes Mother   ? Diabetes Father   ? Hypertension Father   ? ? ?Social History ?Social History  ? ?Tobacco Use  ? Smoking status: Never  ? Smokeless tobacco: Never  ?Substance Use Topics  ? Alcohol use: Yes  ?  Comment: 2-3 cans beer/night  ? Drug use: No  ? ? ? ?Allergies   ?Patient has no known allergies. ? ? ?Review of Systems ?Review of Systems ? ? ?Physical Exam ?Triage Vital Signs ?ED Triage Vitals  ?Enc Vitals Group  ?   BP 01/04/22 1519 (!) 141/85  ?   Pulse Rate 01/04/22 1519 (!) 102  ?   Resp 01/04/22 1519 18  ?   Temp 01/04/22 1519 98.2 ?F (36.8 ?C)  ?   Temp src --   ?   SpO2 01/04/22 1519 98 %  ?   Weight --   ?   Height --   ?   Head Circumference --   ?   Peak Flow --   ?   Pain Score 01/04/22 1518 0  ?   Pain Loc --   ?   Pain Edu? --   ?   Excl. in GC? --   ? ?No data found. ? ?Updated  Vital Signs ?BP (!) 141/85   Pulse (!) 102   Temp 98.2 ?F (36.8 ?C)   Resp 18   SpO2 98%  ? ?Visual Acuity ?Right Eye Distance:   ?Left Eye Distance:   ?Bilateral Distance:   ? ?Right Eye Near:   ?Left Eye Near:    ?Bilateral Near:    ? ?Physical Exam ?Vitals reviewed.  ?Constitutional:   ?   General: He is not in acute distress. ?   Appearance: He is not toxic-appearing.  ?HENT:  ?   Mouth/Throat:  ?   Mouth: Mucous membranes are moist.  ?   Pharynx: No oropharyngeal exudate or posterior oropharyngeal erythema.  ?Eyes:  ?   Extraocular Movements: Extraocular movements intact.  ?Cardiovascular:  ?   Rate and Rhythm: Normal rate and regular rhythm.  ?   Heart sounds: No murmur heard. ?Pulmonary:  ?   Effort: Pulmonary effort is normal.  ?   Breath sounds: Normal breath sounds.  ?Musculoskeletal:  ?   Cervical back: Neck supple.  ?Lymphadenopathy:  ?   Cervical: No cervical adenopathy.  ?Neurological:  ?   Mental Status: He is alert and oriented to person, place, and time.   ?Psychiatric:     ?   Behavior: Behavior normal.  ? ? ? ?UC Treatments / Results  ?Labs ?(all labs ordered are listed, but only abnormal results are displayed) ?Labs Reviewed  ?CBG MONITORING, ED - Abnormal; Notable for the following components:  ?    Result Value  ? Glucose-Capillary 316 (*)   ? All other components within normal limits  ? ? ?EKG ? ? ?Radiology ?No results found. ? ?Procedures ?Procedures (including critical care time) ? ?Medications Ordered in UC ?Medications - No data to display ? ?Initial Impression / Assessment and Plan / UC Course  ?I have reviewed the triage vital signs and the nursing notes. ? ?Pertinent labs & imaging results that were available during my care of the patient were reviewed by me and considered in my medical decision making (see chart for details). ? ?  ? ?Random sugar is 316 today.  I will get his prescription started back for metformin and lisinopril HCT.  Request made to help him find a primary office ?Final Clinical Impressions(s) / UC Diagnoses  ? ?Final diagnoses:  ?Essential hypertension  ?Type 2 diabetes mellitus with hyperglycemia, without long-term current use of insulin (HCC)  ? ? ? ?Discharge Instructions   ? ?  ?Restart lisinopril HCT 20-12.5 mg--1 daily for blood pressure ? ?Restart metformin 1000 mg--1 tablet twice daily with a meal for diabetes ? ? ? ? ? ? ?ED Prescriptions   ? ? Medication Sig Dispense Auth. Provider  ? lisinopril-hydrochlorothiazide (ZESTORETIC) 20-12.5 MG tablet Take 1 tablet by mouth daily. 30 tablet Zenia Resides, MD  ? metFORMIN (GLUCOPHAGE) 1000 MG tablet Take 1 tablet (1,000 mg total) by mouth 2 (two) times daily with a meal. 60 tablet Brynja Marker, Janace Aris, MD  ? ?  ? ?PDMP not reviewed this encounter. ?  ?Zenia Resides, MD ?01/04/22 1601 ? ?

## 2023-02-09 ENCOUNTER — Ambulatory Visit (HOSPITAL_COMMUNITY)
Admission: EM | Admit: 2023-02-09 | Discharge: 2023-02-09 | Disposition: A | Discharge status: Home / Self Care | Payer: Managed Care, Other (non HMO) | Attending: Family Medicine | Admitting: Family Medicine

## 2023-02-09 ENCOUNTER — Encounter (HOSPITAL_COMMUNITY): Payer: Self-pay

## 2023-02-09 DIAGNOSIS — R42 Dizziness and giddiness: Secondary | ICD-10-CM

## 2023-02-09 LAB — BASIC METABOLIC PANEL
Anion gap: 11 (ref 5–15)
BUN: 18 mg/dL (ref 6–20)
CO2: 24 mmol/L (ref 22–32)
Calcium: 8.9 mg/dL (ref 8.9–10.3)
Chloride: 101 mmol/L (ref 98–111)
Creatinine, Ser: 1.22 mg/dL (ref 0.61–1.24)
GFR, Estimated: >60 mL/min (ref >60)
Glucose, Bld: 203 mg/dL — ABNORMAL HIGH (ref 70–99)
Potassium: 3.5 mmol/L (ref 3.5–5.1)
Sodium: 136 mmol/L (ref 135–145)

## 2023-02-09 LAB — CBC
HCT: 41.8 % (ref 39.0–52.0)
Hemoglobin: 14.2 g/dL (ref 13.0–17.0)
MCH: 30.7 pg (ref 26.0–34.0)
MCHC: 34 g/dL (ref 30.0–36.0)
MCV: 90.5 fL (ref 80.0–100.0)
Platelets: 229 10*3/uL (ref 150–400)
RBC: 4.62 MIL/uL (ref 4.22–5.81)
RDW: 13.1 % (ref 11.5–15.5)
WBC: 3.3 10*3/uL — ABNORMAL LOW (ref 4.0–10.5)
nRBC: 0 % (ref 0.0–0.2)

## 2023-02-09 LAB — POCT FASTING CBG KUC MANUAL ENTRY: POCT Glucose (KUC): 202 mg/dL — AB (ref 70–99)

## 2023-02-09 MED ORDER — ONDANSETRON 4 MG PO TBDP: 4.0000 mg | ORAL_TABLET | Freq: Three times a day (TID) | ORAL | 0 refills | Status: DC | PRN

## 2023-02-09 NOTE — Discharge Instructions (Addendum)
Your fingerstick glucose was 202.  We have drawn blood to check your blood counts and your sodium and potassium and kidney function numbers.  Our staff will call you if anything is significantly abnormal and needs treatment.  You can use the QR code/website at the back of the summary paperwork to schedule yourself a new patient appointment with primary care  Ondansetron dissolved in the mouth every 8 hours as needed for nausea or vomiting. Clear liquids and bland things to eat. Avoid acidic foods like lemon/lime/orange/tomato.

## 2023-02-09 NOTE — ED Provider Notes (Signed)
[facility]    CSN: [id number] Arrival date & time: 02/09/23  1645      History   Chief Complaint Chief Complaint  Patient presents with   Dizziness   Anorexia   Diarrhea   Stress    HPI Nathan Duran is a 62 y.o. male.    Dizziness Associated symptoms: diarrhea   Diarrhea  Here for dizziness and lightheadedness that began on April 25.  He began having diarrhea yesterday.  No blood in the stool that he could tell.  He has had some nausea for the last few days also.  No fever or cough or congestion  He does have a history of diabetes and reports that he is taking his metformin as prescribed.  He has not been checking his sugar.  He last ate about 6 or 7 hours ago.  He also relates that he has been stressed for various reasons in the last year.  Past Medical History:  Diagnosis Date   Depression    Diabetes mellitus    Hypertension     Patient Active Problem List   Diagnosis Date Noted   Vitamin D deficiency 07/24/2020   Other hyperlipidemia 07/24/2020   Depression 07/24/2020   Microalbuminuria 07/24/2020   Essential hypertension 04/08/2020   Uncontrolled type 2 diabetes mellitus with hyperglycemia (HCC) 04/08/2020   Alcohol abuse 04/08/2020   Achilles tendinitis, left leg 01/05/2020   Cervical muscle strain 01/05/2020    Past Surgical History:  Procedure Laterality Date   BACK SURGERY         Home Medications    Prior to Admission medications   Medication Sig Start Date End Date Taking? Authorizing Provider  ondansetron (ZOFRAN-ODT) 4 MG disintegrating tablet Take 1 tablet (4 mg total) by mouth every 8 (eight) hours as needed for nausea or vomiting. 02/09/23  Yes Jasiah Elsen, Janace Aris, MD  lisinopril-hydrochlorothiazide (ZESTORETIC) 20-12.5 MG tablet Take 1 tablet by mouth daily. 01/04/22 03/05/22  Zenia Resides, MD  metFORMIN (GLUCOPHAGE) 1000 MG tablet Take 1 tablet (1,000 mg total) by mouth 2 (two) times daily with a meal. 01/04/22  03/05/22  Zenia Resides, MD  ipratropium (ATROVENT) 0.06 % nasal spray Place 2 sprays into both nostrils 4 (four) times daily. 12/26/18 11/19/19  Georgetta Haber, NP    Family History Family History  Problem Relation Age of Onset   Hypertension Mother    Diabetes Mother    Diabetes Father    Hypertension Father     Social History Social History   Tobacco Use   Smoking status: Never   Smokeless tobacco: Never  Vaping Use   Vaping Use: Never used  Substance Use Topics   Alcohol use: Not Currently   Drug use: No     Allergies   Patient has no known allergies.   Review of Systems Review of Systems  Gastrointestinal:  Positive for diarrhea.  Neurological:  Positive for dizziness.     Physical Exam Triage Vital Signs ED Triage Vitals  Enc Vitals Group     BP 02/09/23 1710 111/76     Pulse Rate 02/09/23 1710 (!) 121     Resp 02/09/23 1710 16     Temp 02/09/23 1710 98.2 F (36.8 C)     Temp Source 02/09/23 1710 Oral     SpO2 02/09/23 1710 97 %     Weight --      Height --      Head Circumference --  Peak Flow --      Pain Score 02/09/23 1712 0     Pain Loc --      Pain Edu? --      Excl. in GC? --    No data found.  Updated Vital Signs BP 111/76 (BP Location: Left Arm)   Pulse (!) 121   Temp 98.2 F (36.8 C) (Oral)   Resp 16   SpO2 97%   Visual Acuity Right Eye Distance:   Left Eye Distance:   Bilateral Distance:    Right Eye Near:   Left Eye Near:    Bilateral Near:     Physical Exam Vitals reviewed.  Constitutional:      General: He is not in acute distress.    Appearance: He is not ill-appearing, toxic-appearing or diaphoretic.  HENT:     Mouth/Throat:     Mouth: Mucous membranes are moist.  Eyes:     Extraocular Movements: Extraocular movements intact.     Conjunctiva/sclera: Conjunctivae normal.     Pupils: Pupils are equal, round, and reactive to light.  Cardiovascular:     Rate and Rhythm: Normal rate and regular rhythm.      Heart sounds: No murmur heard. Pulmonary:     Effort: Pulmonary effort is normal.     Breath sounds: Normal breath sounds.  Abdominal:     Palpations: Abdomen is soft.     Tenderness: There is no abdominal tenderness.  Musculoskeletal:     Cervical back: Neck supple.  Lymphadenopathy:     Cervical: No cervical adenopathy.  Skin:    Coloration: Skin is not pale.  Neurological:     Mental Status: He is alert and oriented to person, place, and time.  Psychiatric:        Behavior: Behavior normal.      UC Treatments / Results  Labs (all labs ordered are listed, but only abnormal results are displayed) Labs Reviewed  POCT FASTING CBG [facility] MANUAL ENTRY - Abnormal; Notable for the following components:      Result Value   POCT Glucose ([facility]) 202 (*)    All other components within normal limits  CBC  BASIC METABOLIC PANEL    EKG   Radiology No results found.  Procedures Procedures (including critical care time)  Medications Ordered in UC Medications - No data to display  Initial Impression / Assessment and Plan / UC Course  I have reviewed the triage vital signs and the nursing notes.  Pertinent labs & imaging results that were available during my care of the patient were reviewed by me and considered in my medical decision making (see chart for details).     Fingerstick glucose here is 202.  BMP and CBC are drawn to assess his symptoms.  A year ago I had requested assistance for him to find a PCP and a letter has been sent to him, but he has not been seeing anyone for his primary care.  Assistance is requested again, and I have asked him to try to set up an appointment with primary care sometime soon Final Clinical Impressions(s) / UC Diagnoses   Final diagnoses:  Dizziness     Discharge Instructions      Your fingerstick glucose was 202.  We have drawn blood to check your blood counts and your sodium and potassium and kidney function numbers.  Our staff  will call you if anything is significantly abnormal and needs treatment.  You can use the QR code/website at  the back of the summary paperwork to schedule yourself a new patient appointment with primary care      ED Prescriptions     Medication Sig Dispense Auth. Provider   ondansetron (ZOFRAN-ODT) 4 MG disintegrating tablet Take 1 tablet (4 mg total) by mouth every 8 (eight) hours as needed for nausea or vomiting. 10 tablet Marlinda Mike Janace Aris, MD      PDMP not reviewed this encounter.   Zenia Resides, MD 02/09/23 [phone]

## 2023-02-09 NOTE — ED Triage Notes (Signed)
Patient reports that he has had dizziness, loss of appetite, and diarrhea  x 4 days.  Patient states that he has been stressed due to loss of a friend.

## 2023-09-21 ENCOUNTER — Encounter (HOSPITAL_COMMUNITY): Payer: Self-pay

## 2023-09-21 ENCOUNTER — Ambulatory Visit (HOSPITAL_COMMUNITY)
Admission: EM | Admit: 2023-09-21 | Discharge: 2023-09-21 | Disposition: A | Discharge status: Home / Self Care | Payer: Self-pay | Attending: Family Medicine | Admitting: Family Medicine

## 2023-09-21 DIAGNOSIS — Z760 Encounter for issue of repeat prescription: Secondary | ICD-10-CM | POA: Insufficient documentation

## 2023-09-21 DIAGNOSIS — E119 Type 2 diabetes mellitus without complications: Secondary | ICD-10-CM | POA: Insufficient documentation

## 2023-09-21 DIAGNOSIS — I10 Essential (primary) hypertension: Secondary | ICD-10-CM | POA: Insufficient documentation

## 2023-09-21 LAB — COMPREHENSIVE METABOLIC PANEL
ALT: 20 U/L (ref 0–44)
AST: 27 U/L (ref 15–41)
Albumin: 3.6 g/dL (ref 3.5–5.0)
Alkaline Phosphatase: 68 U/L (ref 38–126)
Anion gap: 9 (ref 5–15)
BUN: 10 mg/dL (ref 6–20)
CO2: 27 mmol/L (ref 22–32)
Calcium: 9.3 mg/dL (ref 8.9–10.3)
Chloride: 103 mmol/L (ref 98–111)
Creatinine, Ser: 0.92 mg/dL (ref 0.61–1.24)
GFR, Estimated: >60 mL/min (ref >60)
Glucose, Bld: 218 mg/dL — ABNORMAL HIGH (ref 70–99)
Potassium: 4.2 mmol/L (ref 3.5–5.1)
Sodium: 139 mmol/L (ref 135–145)
Total Bilirubin: 1.2 mg/dL — ABNORMAL HIGH (ref <1.2)
Total Protein: 7.5 g/dL (ref 6.5–8.1)

## 2023-09-21 LAB — CBC WITH DIFFERENTIAL/PLATELET
Abs Immature Granulocytes: 0.01 10*3/uL (ref 0.00–0.07)
Basophils Absolute: 0 10*3/uL (ref 0.0–0.1)
Basophils Relative: 1 %
Eosinophils Absolute: 0.1 10*3/uL (ref 0.0–0.5)
Eosinophils Relative: 2 %
HCT: 38.4 % — ABNORMAL LOW (ref 39.0–52.0)
Hemoglobin: 13.3 g/dL (ref 13.0–17.0)
Immature Granulocytes: 0 %
Lymphocytes Relative: 15 %
Lymphs Abs: 0.8 10*3/uL (ref 0.7–4.0)
MCH: 31.1 pg (ref 26.0–34.0)
MCHC: 34.6 g/dL (ref 30.0–36.0)
MCV: 89.9 fL (ref 80.0–100.0)
Monocytes Absolute: 0.6 10*3/uL (ref 0.1–1.0)
Monocytes Relative: 10 %
Neutro Abs: 4 10*3/uL (ref 1.7–7.7)
Neutrophils Relative %: 72 %
Platelets: 285 10*3/uL (ref 150–400)
RBC: 4.27 MIL/uL (ref 4.22–5.81)
RDW: 12.9 % (ref 11.5–15.5)
WBC: 5.5 10*3/uL (ref 4.0–10.5)
nRBC: 0 % (ref 0.0–0.2)

## 2023-09-21 LAB — POCT FASTING CBG KUC MANUAL ENTRY: POCT Glucose (KUC): 230 mg/dL — AB (ref 70–99)

## 2023-09-21 MED ORDER — LISINOPRIL-HYDROCHLOROTHIAZIDE 20-12.5 MG PO TABS: 1.0000 | ORAL_TABLET | Freq: Every day | ORAL | 0 refills | Status: AC

## 2023-09-21 MED ORDER — METFORMIN HCL 1000 MG PO TABS: 1000.0000 mg | ORAL_TABLET | Freq: Two times a day (BID) | ORAL | 0 refills | Status: AC

## 2023-09-21 NOTE — ED Notes (Signed)
Called patient from waiting area. No answer.
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
## 2023-09-21 NOTE — ED Triage Notes (Signed)
Pt presents to the office for med-refill on his Diabetes and Hypertension medication. Pt does not remember the name of his medication. Pt states he has been out of medication for months.

## 2023-09-21 NOTE — Discharge Instructions (Addendum)
You were seen today for medication refills.  I have refilled this for you x 90 days.   We have ordered blood work which will be resulted tomorrow.  If there is anything concerning we will notify you.  Your blood sugar is elevated at 230 today.  Your blood pressure is elevated.  Please start your medications as soon as possible.  As discussed, you need to make an appointment for continued monitoring of your medications and blood work.  I have asked for assistance in helping you find a primary care provider. You may also make an appointment yourself at [email or web address].
